# Patient Record
Sex: Female | Born: 1955 | Race: White | Hispanic: No | Marital: Married | Smoking: Never smoker
Health system: Southern US, Community
[De-identification: ages and names within clinical notes are randomized; demographics above are authoritative.]

## PROBLEM LIST (undated history)

## (undated) DIAGNOSIS — I8393 Asymptomatic varicose veins of bilateral lower extremities: Secondary | ICD-10-CM

## (undated) DIAGNOSIS — M1990 Unspecified osteoarthritis, unspecified site: Secondary | ICD-10-CM

## (undated) DIAGNOSIS — B009 Herpesviral infection, unspecified: Secondary | ICD-10-CM

## (undated) DIAGNOSIS — C4490 Unspecified malignant neoplasm of skin, unspecified: Secondary | ICD-10-CM

## (undated) HISTORY — DX: Unspecified malignant neoplasm of skin, unspecified: C44.90

---

## 2000-10-06 ENCOUNTER — Encounter: Payer: Self-pay | Admitting: *Deleted

## 2000-10-06 ENCOUNTER — Ambulatory Visit (HOSPITAL_COMMUNITY): Admission: RE | Admit: 2000-10-06 | Discharge: 2000-10-06 | Payer: Self-pay | Admitting: *Deleted

## 2000-11-01 ENCOUNTER — Other Ambulatory Visit: Admission: RE | Admit: 2000-11-01 | Discharge: 2000-11-01 | Payer: Self-pay | Admitting: *Deleted

## 2001-04-20 HISTORY — PX: GANGLION CYST EXCISION: SHX1691

## 2001-10-24 ENCOUNTER — Encounter: Payer: Self-pay | Admitting: *Deleted

## 2001-10-24 ENCOUNTER — Ambulatory Visit (HOSPITAL_COMMUNITY): Admission: RE | Admit: 2001-10-24 | Discharge: 2001-10-24 | Payer: Self-pay | Admitting: *Deleted

## 2002-10-31 ENCOUNTER — Ambulatory Visit (HOSPITAL_COMMUNITY): Admission: RE | Admit: 2002-10-31 | Discharge: 2002-10-31 | Payer: Self-pay | Admitting: *Deleted

## 2002-10-31 ENCOUNTER — Encounter: Payer: Self-pay | Admitting: *Deleted

## 2003-11-12 ENCOUNTER — Ambulatory Visit (HOSPITAL_COMMUNITY): Admission: RE | Admit: 2003-11-12 | Discharge: 2003-11-12 | Payer: Self-pay | Admitting: *Deleted

## 2004-04-16 ENCOUNTER — Other Ambulatory Visit: Admission: RE | Admit: 2004-04-16 | Discharge: 2004-04-16 | Payer: Self-pay | Admitting: Dermatology

## 2004-05-29 ENCOUNTER — Encounter (INDEPENDENT_AMBULATORY_CARE_PROVIDER_SITE_OTHER): Payer: Self-pay | Admitting: Specialist

## 2004-05-29 ENCOUNTER — Ambulatory Visit (HOSPITAL_BASED_OUTPATIENT_CLINIC_OR_DEPARTMENT_OTHER): Admission: RE | Admit: 2004-05-29 | Discharge: 2004-05-29 | Payer: Self-pay | Admitting: Orthopedic Surgery

## 2004-05-29 ENCOUNTER — Ambulatory Visit (HOSPITAL_COMMUNITY): Admission: RE | Admit: 2004-05-29 | Discharge: 2004-05-29 | Payer: Self-pay | Admitting: Orthopedic Surgery

## 2005-11-26 ENCOUNTER — Ambulatory Visit (HOSPITAL_COMMUNITY): Admission: RE | Admit: 2005-11-26 | Discharge: 2005-11-26 | Payer: Self-pay | Admitting: Internal Medicine

## 2006-11-30 ENCOUNTER — Ambulatory Visit (HOSPITAL_COMMUNITY): Admission: RE | Admit: 2006-11-30 | Discharge: 2006-11-30 | Payer: Self-pay | Admitting: Obstetrics and Gynecology

## 2007-12-01 ENCOUNTER — Ambulatory Visit (HOSPITAL_COMMUNITY): Admission: RE | Admit: 2007-12-01 | Discharge: 2007-12-01 | Payer: Self-pay | Admitting: Obstetrics and Gynecology

## 2008-12-10 ENCOUNTER — Ambulatory Visit (HOSPITAL_COMMUNITY): Admission: RE | Admit: 2008-12-10 | Discharge: 2008-12-10 | Payer: Self-pay | Admitting: Obstetrics and Gynecology

## 2009-12-16 ENCOUNTER — Ambulatory Visit (HOSPITAL_COMMUNITY): Admission: RE | Admit: 2009-12-16 | Discharge: 2009-12-16 | Payer: Self-pay | Admitting: Obstetrics and Gynecology

## 2010-09-05 NOTE — Op Note (Signed)
Peggy Fletcher, SILLIMAN NO.:  [id number]   MEDICAL RECORD NO.:  [id number]          PATIENT TYPE:  AMB   LOCATION:  DSC                          FACILITY:  [facility]   PHYSICIAN:  Cindee Salt, M.D.       DATE OF BIRTH:  [birth year]   DATE OF PROCEDURE:  05/29/2004  DATE OF DISCHARGE:                                 OPERATIVE REPORT   PREOPERATIVE DIAGNOSIS:  Mass in right palm.   POSTOPERATIVE DIAGNOSIS:  Mass in right palm.   OPERATION:  Excision of cyst of right palm.   SURGEON:  Cindee Salt, M.D.   ASSISTANTCarolyne Fiscal.   ANESTHESIA:  Forearm based IV regional.   HISTORY:  The patient is a 55 year old female with a history of a mass in  the metacarpophalangeal joint area of the ring and little fingers.  This has  become painful for her with enlargement.  She is desirous of removal.   DESCRIPTION OF PROCEDURE:  The patient was brought to the operating room  where a forearm based IV regional anesthetic was carried out without  difficulty.  She was prepped using Duraprep in the supine position with the  right arm free.  An oblique incision was made over the mass and carried down  through subcutaneous tissues.  Bleeders were electrocauterized.  Vascular  structures were identified and protected.  These were retracted.  A large  cyst was immediately apparent.  With blunt and sharp dissection, this was  dissected free.  Found to arise from the flexor sheath of the ring finger.  This area was opened.  The cyst was sent to pathology.  No further lesions  were identified.  The wound was irrigated.  The skin was closed with  interrupted 5-0 nylon sutures.  A sterile compressive dressing and splint  were applied.  The patient tolerated the procedure well and was taken to the  recovery room for observation in satisfactory condition.  She is discharged  home to return to the [facility] in one week on Vicodin.      GK/MEDQ  D:  05/29/2004  T:  05/29/2004  Job:   [id number]

## 2011-03-26 ENCOUNTER — Other Ambulatory Visit (HOSPITAL_COMMUNITY): Payer: Self-pay | Admitting: Obstetrics and Gynecology

## 2011-03-26 DIAGNOSIS — Z139 Encounter for screening, unspecified: Secondary | ICD-10-CM

## 2011-04-10 ENCOUNTER — Ambulatory Visit (HOSPITAL_COMMUNITY)
Admission: RE | Admit: 2011-04-10 | Discharge: 2011-04-10 | Disposition: A | Discharge status: Home / Self Care | Payer: BC Managed Care – PPO | Source: Ambulatory Visit | Attending: Obstetrics and Gynecology | Admitting: Obstetrics and Gynecology

## 2011-04-10 DIAGNOSIS — Z139 Encounter for screening, unspecified: Secondary | ICD-10-CM

## 2011-04-10 DIAGNOSIS — Z1231 Encounter for screening mammogram for malignant neoplasm of breast: Secondary | ICD-10-CM | POA: Insufficient documentation

## 2011-10-20 ENCOUNTER — Other Ambulatory Visit (HOSPITAL_COMMUNITY): Payer: Self-pay | Admitting: Family Medicine

## 2011-10-20 ENCOUNTER — Ambulatory Visit (HOSPITAL_COMMUNITY)
Admission: RE | Admit: 2011-10-20 | Discharge: 2011-10-20 | Disposition: A | Discharge status: Home / Self Care | Payer: BC Managed Care – PPO | Source: Ambulatory Visit | Attending: Family Medicine | Admitting: Family Medicine

## 2011-10-20 DIAGNOSIS — M25579 Pain in unspecified ankle and joints of unspecified foot: Secondary | ICD-10-CM | POA: Insufficient documentation

## 2012-03-16 ENCOUNTER — Encounter (HOSPITAL_COMMUNITY): Payer: Self-pay | Admitting: Pharmacy Technician

## 2012-03-25 NOTE — Patient Instructions (Addendum)
   20 Peggy Fletcher  03/25/2012   Your procedure is scheduled on:  03/31/2012  Report to [facility] at  750  AM.  Call this number if you have problems the morning of surgery: [phone]   Remember:   Do not eat food:After Midnight.  May have clear liquids:until Midnight .    Take these medicines the morning of surgery with A SIP OF WATER:  none   Do not wear jewelry, make-up or nail polish.  Do not wear lotions, powders, or perfumes.   Do not shave 48 hours prior to surgery. Men may shave face and neck.  Do not bring valuables to the hospital.  Contacts, dentures or bridgework may not be worn into surgery.  Leave suitcase in the car. After surgery it may be brought to your room.  For patients admitted to the hospital, checkout time is 11:00 AM the day of discharge.   Patients discharged the day of surgery will not be allowed to drive home.  Name and phone number of your driver: family  Special Instructions: Shower using CHG 2 nights before surgery and the night before surgery.  If you shower the day of surgery use CHG.  Use special wash - you have one bottle of CHG for all showers.  You should use approximately 1/3 of the bottle for each shower.   Please read over the following fact sheets that you were given: Pain Booklet, Coughing and Deep Breathing, MRSA Information, Surgical Site Infection Prevention, Anesthesia Post-op Instructions and Care and Recovery After Surgery PATIENT INSTRUCTIONS POST-ANESTHESIA  IMMEDIATELY FOLLOWING SURGERY:  Do not drive or operate machinery for the first twenty four hours after surgery.  Do not make any important decisions for twenty four hours after surgery or while taking narcotic pain medications or sedatives.  If you develop intractable nausea and vomiting or a severe headache please notify your doctor immediately.  FOLLOW-UP:  Please make an appointment with your surgeon as instructed. You do not need to follow up with anesthesia unless  specifically instructed to do so.  WOUND CARE INSTRUCTIONS (if applicable):  Keep a dry clean dressing on the anesthesia/puncture wound site if there is drainage.  Once the wound has quit draining you may leave it open to air.  Generally you should leave the bandage intact for twenty four hours unless there is drainage.  If the epidural site drains for more than 36-48 hours please call the anesthesia department.  QUESTIONS?:  Please feel free to call your physician or the hospital operator if you have any questions, and they will be happy to assist you.

## 2012-03-28 ENCOUNTER — Encounter (HOSPITAL_COMMUNITY)
Admission: RE | Admit: 2012-03-28 | Discharge: 2012-03-28 | Disposition: A | Discharge status: Home / Self Care | Payer: BC Managed Care – PPO | Source: Ambulatory Visit | Attending: Podiatry | Admitting: Podiatry

## 2012-03-28 ENCOUNTER — Encounter (HOSPITAL_COMMUNITY): Payer: Self-pay

## 2012-03-28 HISTORY — DX: Unspecified osteoarthritis, unspecified site: M19.90

## 2012-03-28 HISTORY — DX: Asymptomatic varicose veins of bilateral lower extremities: I83.93

## 2012-03-28 LAB — SURGICAL PCR SCREEN
MRSA, PCR: NEGATIVE
Staphylococcus aureus: NEGATIVE

## 2012-03-28 LAB — HEMOGLOBIN AND HEMATOCRIT, BLOOD
HCT: 38.4 % (ref 36.0–46.0)
Hemoglobin: 12.8 g/dL (ref 12.0–15.0)

## 2012-03-28 NOTE — Progress Notes (Signed)
Dr. Nolen Mu ordered Cleocin 600mg  IV pre-op for pt's upcoming surgery. Pt is allergic to Lincomycin with reaction of hives. There is a possibility of cross reaction to Cleocin because of allergy to Lincomycin so Dr. Nolen Mu was notified and he gave order to switch pre-op antibiotic to Vancomycin 1gm IV. Orders carried out.

## 2012-03-31 ENCOUNTER — Encounter (HOSPITAL_COMMUNITY): Payer: Self-pay | Admitting: Anesthesiology

## 2012-03-31 ENCOUNTER — Ambulatory Visit (HOSPITAL_COMMUNITY): Payer: BC Managed Care – PPO

## 2012-03-31 ENCOUNTER — Encounter (HOSPITAL_COMMUNITY)
Admission: RE | Disposition: A | Discharge status: Home / Self Care | Payer: Self-pay | Source: Ambulatory Visit | Attending: Podiatry

## 2012-03-31 ENCOUNTER — Encounter (HOSPITAL_COMMUNITY): Payer: Self-pay

## 2012-03-31 ENCOUNTER — Ambulatory Visit (HOSPITAL_COMMUNITY): Payer: BC Managed Care – PPO | Admitting: Anesthesiology

## 2012-03-31 ENCOUNTER — Ambulatory Visit (HOSPITAL_COMMUNITY)
Admission: RE | Admit: 2012-03-31 | Discharge: 2012-03-31 | Disposition: A | Discharge status: Home / Self Care | Payer: BC Managed Care – PPO | Source: Ambulatory Visit | Attending: Podiatry | Admitting: Podiatry

## 2012-03-31 DIAGNOSIS — M205X9 Other deformities of toe(s) (acquired), unspecified foot: Secondary | ICD-10-CM

## 2012-03-31 DIAGNOSIS — Z01812 Encounter for preprocedural laboratory examination: Secondary | ICD-10-CM | POA: Insufficient documentation

## 2012-03-31 DIAGNOSIS — I739 Peripheral vascular disease, unspecified: Secondary | ICD-10-CM | POA: Insufficient documentation

## 2012-03-31 HISTORY — DX: Herpesviral infection, unspecified: B00.9

## 2012-03-31 HISTORY — PX: CHEILECTOMY: SHX1336

## 2012-03-31 SURGERY — CHEILECTOMY
Anesthesia: Monitor Anesthesia Care | Site: Foot | Laterality: Right | Wound class: Clean

## 2012-03-31 MED ORDER — 0.9 % SODIUM CHLORIDE (POUR BTL) OPTIME
TOPICAL | Status: DC | PRN
  Administered 2012-03-31: 500 mL

## 2012-03-31 MED ORDER — MIDAZOLAM HCL 5 MG/5ML IJ SOLN
INTRAMUSCULAR | Status: DC | PRN
  Administered 2012-03-31: 2 mg via INTRAVENOUS

## 2012-03-31 MED ORDER — FENTANYL CITRATE 0.05 MG/ML IJ SOLN
INTRAMUSCULAR | Status: AC
  Filled 2012-03-31: qty 2

## 2012-03-31 MED ORDER — VANCOMYCIN HCL IN DEXTROSE 1-5 GM/200ML-% IV SOLN
INTRAVENOUS | Status: AC
  Filled 2012-03-31: qty 200

## 2012-03-31 MED ORDER — MIDAZOLAM HCL 2 MG/2ML IJ SOLN
1.0000 mg | INTRAMUSCULAR | Status: DC | PRN
  Administered 2012-03-31: 2 mg via INTRAVENOUS

## 2012-03-31 MED ORDER — MIDAZOLAM HCL 2 MG/2ML IJ SOLN
INTRAMUSCULAR | Status: AC
  Filled 2012-03-31: qty 2

## 2012-03-31 MED ORDER — PROPOFOL 10 MG/ML IV EMUL
INTRAVENOUS | Status: AC
  Filled 2012-03-31: qty 20

## 2012-03-31 MED ORDER — SODIUM CHLORIDE 0.9 % IV SOLN
INTRAVENOUS | Status: DC | PRN
  Administered 2012-03-31: 09:00:00 via INTRAVENOUS

## 2012-03-31 MED ORDER — BUPIVACAINE HCL (PF) 0.5 % IJ SOLN
INTRAMUSCULAR | Status: AC
  Filled 2012-03-31: qty 30

## 2012-03-31 MED ORDER — LACTATED RINGERS IV SOLN
INTRAVENOUS | Status: DC
  Administered 2012-03-31: 09:00:00 via INTRAVENOUS

## 2012-03-31 MED ORDER — VANCOMYCIN HCL 1000 MG IV SOLR
1000.0000 mg | INTRAVENOUS | Status: DC | PRN
  Administered 2012-03-31: 1000 mg via INTRAVENOUS

## 2012-03-31 MED ORDER — PROPOFOL INFUSION 10 MG/ML OPTIME
INTRAVENOUS | Status: DC | PRN
  Administered 2012-03-31: 55 ug/kg/min via INTRAVENOUS

## 2012-03-31 MED ORDER — FENTANYL CITRATE 0.05 MG/ML IJ SOLN
INTRAMUSCULAR | Status: DC | PRN
  Administered 2012-03-31: 50 ug via INTRAVENOUS
  Administered 2012-03-31: 25 ug via INTRAVENOUS

## 2012-03-31 MED ORDER — BUPIVACAINE HCL (PF) 0.5 % IJ SOLN
INTRAMUSCULAR | Status: DC | PRN
  Administered 2012-03-31: 20 mL

## 2012-03-31 MED ORDER — VANCOMYCIN HCL IN DEXTROSE 1-5 GM/200ML-% IV SOLN: 1000.0000 mg | INTRAVENOUS | Status: DC

## 2012-03-31 SURGICAL SUPPLY — 48 items
BAG HAMPER (MISCELLANEOUS) ×2 IMPLANT
BANDAGE ELASTIC 4 VELCRO NS (GAUZE/BANDAGES/DRESSINGS) ×2 IMPLANT
BANDAGE ESMARK 4X12 BL STRL LF (DISPOSABLE) ×1 IMPLANT
BANDAGE GAUZE ELAST BULKY 4 IN (GAUZE/BANDAGES/DRESSINGS) ×2 IMPLANT
BENZOIN TINCTURE PRP APPL 2/3 (GAUZE/BANDAGES/DRESSINGS) ×2 IMPLANT
BLADE AVERAGE 25X9 (BLADE) ×4 IMPLANT
BLADE OSC/SAGITTAL MD 9X18.5 (BLADE) IMPLANT
BLADE SURG 15 STRL LF DISP TIS (BLADE) ×1 IMPLANT
BLADE SURG 15 STRL SS (BLADE) ×1
BNDG ESMARK 4X12 BLUE STRL LF (DISPOSABLE) ×2
CHLORAPREP W/TINT 26ML (MISCELLANEOUS) ×2 IMPLANT
CLOTH BEACON ORANGE TIMEOUT ST (SAFETY) ×2 IMPLANT
COVER LIGHT HANDLE STERIS (MISCELLANEOUS) ×4 IMPLANT
CUFF TOURN SGL LL 12 (TOURNIQUET CUFF) ×2 IMPLANT
CUFF TOURNIQUET SINGLE 18IN (TOURNIQUET CUFF) ×2 IMPLANT
DECANTER SPIKE VIAL GLASS SM (MISCELLANEOUS) ×2 IMPLANT
DRAPE OEC MINIVIEW 54X84 (DRAPES) ×2 IMPLANT
DRSG ADAPTIC 3X8 NADH LF (GAUZE/BANDAGES/DRESSINGS) ×2 IMPLANT
DURA STEPPER LG (CAST SUPPLIES) IMPLANT
DURA STEPPER MED (CAST SUPPLIES) IMPLANT
DURA STEPPER SML (CAST SUPPLIES) IMPLANT
DURA STEPPER XL (SOFTGOODS) IMPLANT
ELECT REM PT RETURN 9FT ADLT (ELECTROSURGICAL) ×2
ELECTRODE REM PT RTRN 9FT ADLT (ELECTROSURGICAL) ×1 IMPLANT
GAUZE KERLIX 2  STERILE LF (GAUZE/BANDAGES/DRESSINGS) ×2 IMPLANT
GLOVE BIO SURGEON STRL SZ7.5 (GLOVE) ×2 IMPLANT
GOWN STRL REIN XL XLG (GOWN DISPOSABLE) ×6 IMPLANT
K-WIRE 6 (WIRE)
KIT ROOM TURNOVER APOR (KITS) ×2 IMPLANT
KWIRE 6 (WIRE) IMPLANT
Kirschner Wire ×2 IMPLANT
MANIFOLD NEPTUNE II (INSTRUMENTS) ×2 IMPLANT
MARKER SKIN DUAL TIP RULER LAB (MISCELLANEOUS) ×2 IMPLANT
NEEDLE HYPO 18GX1.5 BLUNT FILL (NEEDLE) ×2 IMPLANT
NEEDLE HYPO 27GX1-1/4 (NEEDLE) ×4 IMPLANT
NS IRRIG 1000ML POUR BTL (IV SOLUTION) ×2 IMPLANT
PACK BASIC LIMB (CUSTOM PROCEDURE TRAY) ×2 IMPLANT
PAD ARMBOARD 7.5X6 YLW CONV (MISCELLANEOUS) ×2 IMPLANT
RASP SM TEAR CROSS CUT (RASP) ×2 IMPLANT
SET BASIN LINEN APH (SET/KITS/TRAYS/PACK) ×2 IMPLANT
SPONGE GAUZE 4X4 12PLY (GAUZE/BANDAGES/DRESSINGS) ×2 IMPLANT
SPONGE LAP 18X18 X RAY DECT (DISPOSABLE) ×2 IMPLANT
STRIP CLOSURE SKIN 1/2X4 (GAUZE/BANDAGES/DRESSINGS) ×2 IMPLANT
SUT BONE WAX W31G (SUTURE) ×4 IMPLANT
SUT VIC AB 2-0 CT2 27 (SUTURE) ×2 IMPLANT
SUT VIC AB 4-0 PS2 27 (SUTURE) ×2 IMPLANT
SUT VICRYL AB 3-0 FS1 BRD 27IN (SUTURE) ×2 IMPLANT
SYR CONTROL 10ML LL (SYRINGE) ×4 IMPLANT

## 2012-03-31 NOTE — Anesthesia Preprocedure Evaluation (Signed)
Anesthesia Evaluation  Patient identified by MRN, date of birth, ID band Patient awake    Reviewed: Allergy & Precautions, H&P , NPO status , Patient's Chart, lab work & pertinent test results  Airway Mallampati: I      Dental  (+) Teeth Intact   Pulmonary neg pulmonary ROS,  breath sounds clear to auscultation        Cardiovascular + Peripheral Vascular Disease Rhythm:Regular Rate:Normal     Neuro/Psych    GI/Hepatic negative GI ROS,   Endo/Other    Renal/GU      Musculoskeletal   Abdominal   Peds  Hematology   Anesthesia Other Findings   Reproductive/Obstetrics                           Anesthesia Physical Anesthesia Plan  ASA: I  Anesthesia Plan: MAC   Post-op Pain Management:    Induction: Intravenous  Airway Management Planned: Nasal Cannula  Additional Equipment:   Intra-op Plan:   Post-operative Plan:   Informed Consent: I have reviewed the patients History and Physical, chart, labs and discussed the procedure including the risks, benefits and alternatives for the proposed anesthesia with the patient or authorized representative who has indicated his/her understanding and acceptance.     Plan Discussed with:   Anesthesia Plan Comments:         Anesthesia Quick Evaluation

## 2012-03-31 NOTE — Op Note (Signed)
OPERATIVE REPORT  SURGEON:   Dallas Schimke, [place]  OR STAFF:   Eliane Decree Page, RN - Circulator Hurshel Party, CST - Scrub Person Lizabeth Leyden, RN - RN First Assistant Rogene Houston, RN - Circulator Assistant   PREOPERATIVE DIAGNOSIS:   Hallux limitus right foot  POSTOPERATIVE DIAGNOSIS: Same  PROCEDURE: Cheilectomy right foot  ANESTHESIA:  Monitor Anesthesia Care   HEMOSTASIS:   Pneumatic ankle tourniquet set at 250 mmHg  ESTIMATED BLOOD LOSS:   Minimal (<5 cc)  MATERIALS USED:  Bone wax  INJECTABLES: Marcaine 0.5% plain; 20mL  PATHOLOGY:   None  COMPLICATIONS:   None  INDICATIONS:  Painful 1st MTPJ range of motion that has been non-responsive to non-surgical care.  DESCRIPTION OF THE PROCEDURE:   The patient was brought to the operating room and placed on the operative table in the supine position.  A pneumatic ankle tourniquet was applied to the patient's ankle.  Following sedation, the surgical site was anesthetized with 0.5% Marcaine plain.  The foot was then prepped, scrubbed, and draped in the usual sterile technique.  The foot was elevated, exsanguinated and the pneumatic ankle tourniquet inflated to 250 mmHg.    Attention was directed to the dorsal aspect of the right foot.  A linear longitudinal incision was made medial and parallel to the extensor hallucis longus tendon.  Dissection was continued deep down to the level of the first metatarsophalangeal joint.  A linear longitudinal periosteal and capsule are incision was performed.  The periosteal and capsular structures were reflected medially and laterally thus exposing the first metatarsophalangeal joint.  Spurring was noted along the dorsal aspect of the first metatarsal head with adaptive changes to the base of the proximal phalanx.  A prominent medial eminence of the first metatarsal head was also noted.  The articular surface of the first metatarsal head was evaluated.  The dorsal  50% of the articular surface of the first metatarsal head was denuded.  Using a power bone saw the dorsal spur the first metatarsal head and prominent medial eminence was resected.  A bone rongeur was used to resect the adaptive changes to the base of the proximal phalanx.  All bone edges were smoothed with a power rasp.  Bone wax was applied to the resected areas of bone.  A 0.045 inch Kirschner wire was used to drill the area of denuded cartilage of the first metatarsal headto promote fibrocartilage ingrowth.  The first MPJ range of motion was noted to be improved.  The wound was irrigated with copious amounts of sterile irrigant.  The periosteal and capsular structures were reapproximated using Vicryl.the subcutaneous structures were reapproximated using 4-0 Vicryl.  The skin was reapproximated using 4-0 Vicryl.  Skin closure was reinforced with Dermabond.  A sterile compressive dressing was applied to the right foot.  The patient's ankle tourniquet was deflated.  A prompt hyperemic response was noted to all digits of the operative foot.    The patient tolerated the procedure well.  The patient was then transferred to Short Stay with vital signs stable and vascular status intact to all toes of the operative foot.  Following a period of postoperative monitoring, the patient will be discharged home.

## 2012-03-31 NOTE — Brief Op Note (Signed)
BRIEF OPERATIVE NOTE  SURGEON:   Dallas Schimke, DPM  OR STAFF:   Eliane Decree Page, RN - Circulator Hurshel Party, CST - Scrub Person Lizabeth Leyden, RN - RN First Assistant Rogene Houston, RN - Circulator Assistant   PREOPERATIVE DIAGNOSIS:   Hallux limitus right foot  POSTOPERATIVE DIAGNOSIS: Same  PROCEDURE: Cheilectomy right foot  ANESTHESIA:  Monitor Anesthesia Care   HEMOSTASIS:   Pneumatic ankle tourniquet set at 250 mmHg  ESTIMATED BLOOD LOSS:   Minimal (<5 cc)  MATERIALS USED:  Bone wax  INJECTABLES: Marcaine 0.5% plain; 20mL  PATHOLOGY:   None  COMPLICATIONS:   None  INDICATIONS:  Painful 1st MTPJ range of motion that has been non-responsive to non-surgical care.  DICTATION:  Note written in EPIC

## 2012-03-31 NOTE — Anesthesia Postprocedure Evaluation (Signed)
  Anesthesia Post-op Note  Patient: Peggy Fletcher  Procedure(s) Performed: Procedure(s) (LRB) with comments: CHEILECTOMY (Right)  Patient Location: Short Stay  Anesthesia Type:MAC  Level of Consciousness: awake, alert , oriented and patient cooperative  Airway and Oxygen Therapy: Patient Spontanous Breathing  Post-op Pain: none  Post-op Assessment: Post-op Vital signs reviewed, Patient's Cardiovascular Status Stable, Respiratory Function Stable, Patent Airway and Pain level controlled  Post-op Vital Signs: Reviewed and stable  Complications: No apparent anesthesia complications

## 2012-03-31 NOTE — Transfer of Care (Signed)
Immediate Anesthesia Transfer of Care Note  Patient: Peggy Fletcher  Procedure(s) Performed: Procedure(s) (LRB) with comments: CHEILECTOMY (Right)  Patient Location: Short Stay  Anesthesia Type:MAC  Level of Consciousness: awake, alert , oriented and patient cooperative  Airway & Oxygen Therapy: Patient Spontanous Breathing  Post-op Assessment: Report given to PACU RN, Post -op Vital signs reviewed and stable and Patient moving all extremities  Post vital signs: Reviewed and stable  Complications: No apparent anesthesia complications

## 2012-03-31 NOTE — H&P (Signed)
HISTORY AND PHYSICAL INTERVAL NOTE:  03/31/2012  9:20 AM  Peggy Fletcher  has presented today for surgery, with the diagnosis of hallux limitus right foot.  The various methods of treatment have been discussed with the patient.  No guarantees were given.  After consideration of risks, benefits and other options for treatment, the patient has consented to surgery.  I have reviewed the patients' chart and labs.    Patient Vitals for the past 24 hrs:  BP Temp Temp src Pulse Resp SpO2  03/31/12 0905 109/71 mmHg - - - 15  100 %  03/31/12 0900 114/71 mmHg - - - 18  100 %  03/31/12 0855 119/78 mmHg - - - 22  100 %  03/31/12 0850 118/75 mmHg - - - - 98 %  03/31/12 0845 106/71 mmHg - - - 15  100 %  03/31/12 0840 112/76 mmHg - - - 14  100 %  03/31/12 0835 109/75 mmHg - - - 22  100 %  03/31/12 0830 120/78 mmHg - - - 14  100 %  03/31/12 0825 115/75 mmHg - - - 18  99 %  03/31/12 0820 115/78 mmHg - - - 13  99 %  03/31/12 0815 117/78 mmHg - - - 19  100 %  03/31/12 0810 127/81 mmHg - - - 31  98 %  03/31/12 0808 127/81 mmHg 98.1 F (36.7 C) Oral 67  22  99 %    A history and physical examination was performed in my office.  The patient was reexamined.  There have been no changes to this history and physical examination.  Dallas Schimke, DPM

## 2012-04-01 ENCOUNTER — Encounter (HOSPITAL_COMMUNITY): Payer: Self-pay | Admitting: Podiatry

## 2012-11-01 ENCOUNTER — Other Ambulatory Visit (HOSPITAL_COMMUNITY): Payer: Self-pay | Admitting: Obstetrics and Gynecology

## 2012-11-01 DIAGNOSIS — Z139 Encounter for screening, unspecified: Secondary | ICD-10-CM

## 2012-11-04 ENCOUNTER — Ambulatory Visit (HOSPITAL_COMMUNITY)
Admission: RE | Admit: 2012-11-04 | Discharge: 2012-11-04 | Disposition: A | Discharge status: Home / Self Care | Payer: BC Managed Care – PPO | Source: Ambulatory Visit | Attending: Obstetrics and Gynecology | Admitting: Obstetrics and Gynecology

## 2012-11-04 DIAGNOSIS — Z1231 Encounter for screening mammogram for malignant neoplasm of breast: Secondary | ICD-10-CM | POA: Insufficient documentation

## 2012-11-04 DIAGNOSIS — Z139 Encounter for screening, unspecified: Secondary | ICD-10-CM

## 2012-12-25 ENCOUNTER — Encounter (HOSPITAL_COMMUNITY): Payer: Self-pay

## 2012-12-25 ENCOUNTER — Emergency Department (HOSPITAL_COMMUNITY)
Admission: EM | Admit: 2012-12-25 | Discharge: 2012-12-25 | Disposition: A | Discharge status: Home / Self Care | Payer: BC Managed Care – PPO | Attending: Emergency Medicine | Admitting: Emergency Medicine

## 2012-12-25 DIAGNOSIS — Z8679 Personal history of other diseases of the circulatory system: Secondary | ICD-10-CM | POA: Insufficient documentation

## 2012-12-25 DIAGNOSIS — S81812A Laceration without foreign body, left lower leg, initial encounter: Secondary | ICD-10-CM

## 2012-12-25 DIAGNOSIS — W260XXA Contact with knife, initial encounter: Secondary | ICD-10-CM | POA: Insufficient documentation

## 2012-12-25 DIAGNOSIS — Y929 Unspecified place or not applicable: Secondary | ICD-10-CM | POA: Insufficient documentation

## 2012-12-25 DIAGNOSIS — Z8619 Personal history of other infectious and parasitic diseases: Secondary | ICD-10-CM | POA: Insufficient documentation

## 2012-12-25 DIAGNOSIS — M129 Arthropathy, unspecified: Secondary | ICD-10-CM | POA: Insufficient documentation

## 2012-12-25 DIAGNOSIS — Y9389 Activity, other specified: Secondary | ICD-10-CM | POA: Insufficient documentation

## 2012-12-25 DIAGNOSIS — S81009A Unspecified open wound, unspecified knee, initial encounter: Secondary | ICD-10-CM | POA: Insufficient documentation

## 2012-12-25 DIAGNOSIS — Z7982 Long term (current) use of aspirin: Secondary | ICD-10-CM | POA: Insufficient documentation

## 2012-12-25 MED ORDER — CYCLOBENZAPRINE HCL 10 MG PO TABS
10.0000 mg | ORAL_TABLET | Freq: Once | ORAL | Status: AC
  Administered 2012-12-25: 10 mg via ORAL
  Filled 2012-12-25: qty 1

## 2012-12-25 MED ORDER — LIDOCAINE HCL (PF) 1 % IJ SOLN
INTRAMUSCULAR | Status: AC
  Administered 2012-12-25: 5 mL
  Filled 2012-12-25: qty 5

## 2012-12-25 MED ORDER — LIDOCAINE HCL (PF) 1 % IJ SOLN
5.0000 mL | Freq: Once | INTRAMUSCULAR | Status: AC
  Administered 2012-12-25: 5 mL

## 2012-12-25 MED ORDER — ONDANSETRON 8 MG PO TBDP
8.0000 mg | ORAL_TABLET | Freq: Once | ORAL | Status: AC
  Administered 2012-12-25: 8 mg via ORAL
  Filled 2012-12-25: qty 1

## 2012-12-25 NOTE — ED Notes (Signed)
Upon standing to leave patient started to experience cramping in left calf. Patient became nauseated from cramps. Patient requesting something from cramping and nausea. EDPA made aware-verbal orders given.

## 2012-12-25 NOTE — ED Notes (Signed)
Pt tripped and cut left lower leg with knife,  Unsure of last tetanus shot

## 2012-12-25 NOTE — ED Provider Notes (Signed)
CSN: [id number]     Arrival date & time 12/25/12  1054 History   First MD Initiated Contact with Patient 12/25/12 1208     Chief Complaint  Patient presents with  . Extremity Laceration   (Consider location/radiation/quality/duration/timing/severity/associated sxs/prior Treatment) HPI Comments: Nocole Zammit is a 57 y.o. Female presenting with a laceration to her left anterior distal leg.  She tripped and was cut by a kitchen knife she had inside the tote she was carrying.  Her pain is improved at this time and the wound is hemostatic after applying gentle pressure.  She denies other injury.  She does not know the date of her last tetanus shot.     The history is provided by the patient and the spouse.    Past Medical History  Diagnosis Date  . Arthritis   . Varicose veins of legs   . Herpes    Past Surgical History  Procedure Laterality Date  . Ganglion cyst excision  2003    [facility] [place], [place]  . Cheilectomy  03/31/2012    Procedure: CHEILECTOMY;  Surgeon: Dallas Schimke, DPM;  Location: [facility];  Service: Orthopedics;  Laterality: Right;   No family history on file. History  Substance Use Topics  . Smoking status: Never Smoker   . Smokeless tobacco: Not on file  . Alcohol Use: No   OB History   Grav Para Term Preterm Abortions TAB SAB Ect Mult Living                 Review of Systems  Constitutional: Negative for fever and chills.  HENT: Negative for facial swelling.   Respiratory: Negative for shortness of breath and wheezing.   Skin: Positive for wound.  Neurological: Negative for numbness.    Allergies  Keflex and Lincocin  Home Medications   Current Outpatient Rx  Name  Route  Sig  Dispense  Refill  . aspirin EC 81 MG tablet   Oral   Take 81 mg by mouth daily.         Marland Kitchen ibuprofen (ADVIL,MOTRIN) 200 MG tablet   Oral   Take 800 mg by mouth every 6 (six) hours as needed. Pain.          BP 112/87  Pulse 75  Temp(Src)  97.6 F (36.4 C) (Oral)  Resp 20  Ht 5\' 7"  (1.702 m)  Wt 158 lb (71.668 kg)  BMI 24.74 kg/m2  SpO2 100% Physical Exam  Constitutional: She is oriented to person, place, and time. She appears well-developed and well-nourished.  HENT:  Head: Normocephalic and atraumatic.  Cardiovascular: Normal rate.   Pulmonary/Chest: Effort normal.  Musculoskeletal: She exhibits no tenderness.  Neurological: She is alert and oriented to person, place, and time. No sensory deficit.  Skin: Laceration noted.  1.5 cm laceration through subcutaneous layer of left anterior lower leg, hemostatic.    ED Course  Procedures (including critical care time)   LACERATION REPAIR Performed by: Burgess Amor Authorized by: Burgess Amor Consent: Verbal consent obtained. Risks and benefits: risks, benefits and alternatives were discussed Consent given by: patient Patient identity confirmed: provided demographic data Prepped and Draped in normal sterile fashion Wound explored  Laceration Location: left lower leg  Laceration Length: 1.5 cm  No Foreign Bodies seen or palpated  Anesthesia: local infiltration  Local anesthetic: lidocaine 1% without epinephrine  Anesthetic total: 2 ml  Irrigation method: syringe Amount of cleaning: standard  Skin closure: ethilon 4-0  Number  of sutures: 4  Technique: simple interrupted Patient tolerance: Patient tolerated the procedure well with no immediate complications.  Labs Review Labs Reviewed - No data to display Imaging Review No results found.  MDM   1. Laceration of left leg, initial encounter    Wound care instructions given.  Pt advised to have sutures removed in 10 days,  Return here sooner for any signs of infection including redness, swelling, worse pain or drainage of pus.       Burgess Amor, PA-C 12/25/12 2106

## 2012-12-27 NOTE — ED Provider Notes (Signed)
Medical screening examination/treatment/procedure(s) were performed by non-physician practitioner and as supervising physician I was immediately available for consultation/collaboration.  Rhianon Zabawa, MD 12/27/12 0016 

## 2013-09-19 ENCOUNTER — Encounter (INDEPENDENT_AMBULATORY_CARE_PROVIDER_SITE_OTHER): Payer: Self-pay

## 2013-09-19 ENCOUNTER — Ambulatory Visit (HOSPITAL_COMMUNITY)
Admission: RE | Admit: 2013-09-19 | Discharge: 2013-09-19 | Disposition: A | Discharge status: Home / Self Care | Payer: BC Managed Care – PPO | Source: Ambulatory Visit | Attending: Family Medicine | Admitting: Family Medicine

## 2013-09-19 ENCOUNTER — Other Ambulatory Visit (HOSPITAL_COMMUNITY): Payer: Self-pay | Admitting: Family Medicine

## 2013-09-19 DIAGNOSIS — M1610 Unilateral primary osteoarthritis, unspecified hip: Secondary | ICD-10-CM

## 2013-09-19 DIAGNOSIS — M169 Osteoarthritis of hip, unspecified: Secondary | ICD-10-CM | POA: Insufficient documentation

## 2013-09-19 DIAGNOSIS — M47817 Spondylosis without myelopathy or radiculopathy, lumbosacral region: Secondary | ICD-10-CM | POA: Insufficient documentation

## 2013-11-06 ENCOUNTER — Other Ambulatory Visit (HOSPITAL_COMMUNITY): Payer: Self-pay | Admitting: Obstetrics and Gynecology

## 2013-11-06 DIAGNOSIS — Z1231 Encounter for screening mammogram for malignant neoplasm of breast: Secondary | ICD-10-CM

## 2013-11-09 ENCOUNTER — Ambulatory Visit (HOSPITAL_COMMUNITY)
Admission: RE | Admit: 2013-11-09 | Discharge: 2013-11-09 | Disposition: A | Discharge status: Home / Self Care | Payer: BC Managed Care – PPO | Source: Ambulatory Visit | Attending: Obstetrics and Gynecology | Admitting: Obstetrics and Gynecology

## 2013-11-09 DIAGNOSIS — Z1231 Encounter for screening mammogram for malignant neoplasm of breast: Secondary | ICD-10-CM

## 2014-11-05 ENCOUNTER — Other Ambulatory Visit (HOSPITAL_COMMUNITY): Payer: Self-pay | Admitting: Family Medicine

## 2014-11-05 DIAGNOSIS — E038 Other specified hypothyroidism: Secondary | ICD-10-CM

## 2014-11-05 DIAGNOSIS — Z1389 Encounter for screening for other disorder: Secondary | ICD-10-CM

## 2014-11-05 DIAGNOSIS — M1991 Primary osteoarthritis, unspecified site: Secondary | ICD-10-CM

## 2014-11-13 ENCOUNTER — Ambulatory Visit (HOSPITAL_COMMUNITY)
Admission: RE | Admit: 2014-11-13 | Discharge: 2014-11-13 | Disposition: A | Discharge status: Home / Self Care | Payer: BC Managed Care – PPO | Source: Ambulatory Visit | Attending: Family Medicine | Admitting: Family Medicine

## 2014-11-13 DIAGNOSIS — M8580 Other specified disorders of bone density and structure, unspecified site: Secondary | ICD-10-CM | POA: Diagnosis not present

## 2014-11-13 DIAGNOSIS — Z1389 Encounter for screening for other disorder: Secondary | ICD-10-CM

## 2014-11-13 DIAGNOSIS — R29890 Loss of height: Secondary | ICD-10-CM | POA: Diagnosis not present

## 2014-11-13 DIAGNOSIS — Z780 Asymptomatic menopausal state: Secondary | ICD-10-CM | POA: Diagnosis present

## 2014-11-13 DIAGNOSIS — E038 Other specified hypothyroidism: Secondary | ICD-10-CM

## 2014-11-13 DIAGNOSIS — M1991 Primary osteoarthritis, unspecified site: Secondary | ICD-10-CM

## 2015-03-01 ENCOUNTER — Other Ambulatory Visit (HOSPITAL_COMMUNITY): Payer: Self-pay | Admitting: Obstetrics and Gynecology

## 2015-03-01 DIAGNOSIS — Z1231 Encounter for screening mammogram for malignant neoplasm of breast: Secondary | ICD-10-CM

## 2015-03-18 ENCOUNTER — Ambulatory Visit (HOSPITAL_COMMUNITY)
Admission: RE | Admit: 2015-03-18 | Discharge: 2015-03-18 | Disposition: A | Discharge status: Home / Self Care | Payer: BC Managed Care – PPO | Source: Ambulatory Visit | Attending: Obstetrics and Gynecology | Admitting: Obstetrics and Gynecology

## 2015-03-18 DIAGNOSIS — Z1231 Encounter for screening mammogram for malignant neoplasm of breast: Secondary | ICD-10-CM | POA: Diagnosis present

## 2017-10-04 ENCOUNTER — Other Ambulatory Visit (HOSPITAL_COMMUNITY): Payer: Self-pay | Admitting: Obstetrics and Gynecology

## 2017-10-04 DIAGNOSIS — Z1231 Encounter for screening mammogram for malignant neoplasm of breast: Secondary | ICD-10-CM

## 2017-10-11 ENCOUNTER — Ambulatory Visit (HOSPITAL_COMMUNITY): Payer: BC Managed Care – PPO

## 2017-10-14 ENCOUNTER — Ambulatory Visit (HOSPITAL_COMMUNITY): Payer: BC Managed Care – PPO

## 2017-10-28 ENCOUNTER — Ambulatory Visit (HOSPITAL_COMMUNITY)
Admission: RE | Admit: 2017-10-28 | Discharge: 2017-10-28 | Disposition: A | Discharge status: Home / Self Care | Payer: BC Managed Care – PPO | Source: Ambulatory Visit | Attending: Obstetrics and Gynecology | Admitting: Obstetrics and Gynecology

## 2017-10-28 DIAGNOSIS — Z1231 Encounter for screening mammogram for malignant neoplasm of breast: Secondary | ICD-10-CM | POA: Insufficient documentation

## 2018-10-25 ENCOUNTER — Other Ambulatory Visit (HOSPITAL_COMMUNITY): Payer: Self-pay | Admitting: Family Medicine

## 2018-10-25 DIAGNOSIS — Z1231 Encounter for screening mammogram for malignant neoplasm of breast: Secondary | ICD-10-CM

## 2018-11-02 ENCOUNTER — Other Ambulatory Visit: Payer: Self-pay

## 2018-11-02 ENCOUNTER — Ambulatory Visit (HOSPITAL_COMMUNITY)
Admission: RE | Admit: 2018-11-02 | Discharge: 2018-11-02 | Disposition: A | Discharge status: Home / Self Care | Payer: BC Managed Care – PPO | Source: Ambulatory Visit | Attending: Family Medicine | Admitting: Family Medicine

## 2018-11-02 DIAGNOSIS — Z1231 Encounter for screening mammogram for malignant neoplasm of breast: Secondary | ICD-10-CM | POA: Insufficient documentation

## 2018-11-03 ENCOUNTER — Other Ambulatory Visit (HOSPITAL_COMMUNITY): Payer: Self-pay | Admitting: Family Medicine

## 2018-11-03 DIAGNOSIS — R928 Other abnormal and inconclusive findings on diagnostic imaging of breast: Secondary | ICD-10-CM

## 2018-11-08 ENCOUNTER — Ambulatory Visit (HOSPITAL_COMMUNITY)
Admission: RE | Admit: 2018-11-08 | Discharge: 2018-11-08 | Disposition: A | Discharge status: Home / Self Care | Payer: BC Managed Care – PPO | Source: Ambulatory Visit | Attending: Family Medicine | Admitting: Family Medicine

## 2018-11-08 ENCOUNTER — Other Ambulatory Visit: Payer: Self-pay

## 2018-11-08 ENCOUNTER — Ambulatory Visit (HOSPITAL_COMMUNITY): Admission: RE | Admit: 2018-11-08 | Payer: BC Managed Care – PPO | Source: Ambulatory Visit

## 2018-11-08 DIAGNOSIS — R928 Other abnormal and inconclusive findings on diagnostic imaging of breast: Secondary | ICD-10-CM

## 2019-10-05 ENCOUNTER — Other Ambulatory Visit (HOSPITAL_COMMUNITY): Payer: Self-pay | Admitting: Family Medicine

## 2019-10-05 DIAGNOSIS — Z1231 Encounter for screening mammogram for malignant neoplasm of breast: Secondary | ICD-10-CM

## 2019-11-06 ENCOUNTER — Ambulatory Visit (HOSPITAL_COMMUNITY): Payer: BC Managed Care – PPO

## 2019-11-13 ENCOUNTER — Ambulatory Visit (HOSPITAL_COMMUNITY)
Admission: RE | Admit: 2019-11-13 | Discharge: 2019-11-13 | Disposition: A | Discharge status: Home / Self Care | Payer: BC Managed Care – PPO | Source: Ambulatory Visit | Attending: Family Medicine | Admitting: Family Medicine

## 2019-11-13 ENCOUNTER — Other Ambulatory Visit: Payer: Self-pay

## 2019-11-13 DIAGNOSIS — Z1231 Encounter for screening mammogram for malignant neoplasm of breast: Secondary | ICD-10-CM | POA: Diagnosis present

## 2020-05-25 LAB — COLOGUARD: COLOGUARD: NEGATIVE

## 2020-10-15 ENCOUNTER — Other Ambulatory Visit (HOSPITAL_COMMUNITY): Payer: Self-pay | Admitting: Family Medicine

## 2020-10-15 DIAGNOSIS — Z1231 Encounter for screening mammogram for malignant neoplasm of breast: Secondary | ICD-10-CM

## 2020-11-11 ENCOUNTER — Other Ambulatory Visit: Payer: Self-pay

## 2020-11-11 ENCOUNTER — Ambulatory Visit (HOSPITAL_COMMUNITY)
Admission: RE | Admit: 2020-11-11 | Discharge: 2020-11-11 | Disposition: A | Discharge status: Home / Self Care | Payer: BC Managed Care – PPO | Source: Ambulatory Visit | Attending: Family Medicine | Admitting: Family Medicine

## 2020-11-11 DIAGNOSIS — Z1231 Encounter for screening mammogram for malignant neoplasm of breast: Secondary | ICD-10-CM | POA: Insufficient documentation

## 2021-08-04 ENCOUNTER — Encounter (INDEPENDENT_AMBULATORY_CARE_PROVIDER_SITE_OTHER): Payer: Self-pay | Admitting: *Deleted

## 2021-09-04 ENCOUNTER — Other Ambulatory Visit (INDEPENDENT_AMBULATORY_CARE_PROVIDER_SITE_OTHER): Payer: Self-pay

## 2021-09-04 DIAGNOSIS — Z1211 Encounter for screening for malignant neoplasm of colon: Secondary | ICD-10-CM

## 2021-09-17 ENCOUNTER — Telehealth (INDEPENDENT_AMBULATORY_CARE_PROVIDER_SITE_OTHER): Payer: Self-pay

## 2021-09-17 ENCOUNTER — Encounter (INDEPENDENT_AMBULATORY_CARE_PROVIDER_SITE_OTHER): Payer: Self-pay

## 2021-09-17 MED ORDER — PEG 3350-KCL-NA BICARB-NACL 420 G PO SOLR: 4000.0000 mL | ORAL | 0 refills | Status: DC

## 2021-09-17 MED ORDER — NA SULFATE-K SULFATE-MG SULF 17.5-3.13-1.6 GM/177ML PO SOLN: 1.0000 | ORAL | 0 refills | Status: DC

## 2021-09-17 NOTE — Telephone Encounter (Signed)
Peggy Fletcher Peggy Fletcher, CMA  ?

## 2021-09-17 NOTE — Telephone Encounter (Signed)
Refoel Palladino Ann Deandrea Rion, CMA  ?

## 2021-09-17 NOTE — Telephone Encounter (Signed)
Ok to schedule.  Thanks,  Anntionette Madkins Castaneda Mayorga, MD Gastroenterology and Hepatology [facility]  

## 2021-09-17 NOTE — Telephone Encounter (Signed)
Referring MD/PCP: Hilma Favors  Procedure: Tcs  Reason/Indication:  Screening  Has patient had this procedure before?  yes  If so, when, by whom and where? 2008  Is there a family history of colon cancer?  no  Who?  What age when diagnosed?    Is patient diabetic? If yes, Type 1 or Type 2   no      Does patient have prosthetic heart valve or mechanical valve?  no  Do you have a pacemaker/defibrillator?  no  Has patient ever had endocarditis/atrial fibrillation? no  Does patient use oxygen? no  Has patient had joint replacement within last 12 months?  no  Is patient constipated or do they take laxatives? no  Does patient have a history of alcohol/drug use?  no  Have you had a stroke/heart attack last 6 mths? no  Do you take medicine for weight loss?  no  For female patients,: have you had a hysterectomy no                      are you post menopausal yes                      do you still have your menstrual cycle no  Is patient on blood thinner such as Coumadin, Plavix and/or Aspirin? yes  Medications: asa 81 mg daily, levothyroxine 75 mcg daily, valacyclovir 1 gr PRN, xolpidem 10 mg prn, meloxicam 15 mg prn   Allergies: Keflex   Medication Adjustment per Dr Jenetta Downer none  Procedure date & time: 10/17/21 at 7:30

## 2021-09-18 ENCOUNTER — Encounter (INDEPENDENT_AMBULATORY_CARE_PROVIDER_SITE_OTHER): Payer: Self-pay | Admitting: *Deleted

## 2021-10-13 ENCOUNTER — Other Ambulatory Visit (HOSPITAL_COMMUNITY): Payer: Self-pay | Admitting: Family Medicine

## 2021-10-13 DIAGNOSIS — Z1231 Encounter for screening mammogram for malignant neoplasm of breast: Secondary | ICD-10-CM

## 2021-10-14 ENCOUNTER — Other Ambulatory Visit (INDEPENDENT_AMBULATORY_CARE_PROVIDER_SITE_OTHER): Payer: Self-pay

## 2021-10-17 ENCOUNTER — Ambulatory Visit (HOSPITAL_COMMUNITY): Payer: Medicare PPO | Admitting: Certified Registered"

## 2021-10-17 ENCOUNTER — Encounter (HOSPITAL_COMMUNITY)
Admission: RE | Disposition: A | Discharge status: Home / Self Care | Payer: Self-pay | Source: Home / Self Care | Attending: Gastroenterology

## 2021-10-17 ENCOUNTER — Ambulatory Visit (HOSPITAL_COMMUNITY)
Admission: RE | Admit: 2021-10-17 | Discharge: 2021-10-17 | Disposition: A | Discharge status: Home / Self Care | Payer: Medicare PPO | Attending: Gastroenterology | Admitting: Gastroenterology

## 2021-10-17 ENCOUNTER — Encounter (HOSPITAL_COMMUNITY): Payer: Self-pay | Admitting: Gastroenterology

## 2021-10-17 ENCOUNTER — Ambulatory Visit (HOSPITAL_BASED_OUTPATIENT_CLINIC_OR_DEPARTMENT_OTHER): Payer: Medicare PPO | Admitting: Certified Registered"

## 2021-10-17 ENCOUNTER — Other Ambulatory Visit: Payer: Self-pay

## 2021-10-17 DIAGNOSIS — K648 Other hemorrhoids: Secondary | ICD-10-CM

## 2021-10-17 DIAGNOSIS — Z1211 Encounter for screening for malignant neoplasm of colon: Secondary | ICD-10-CM | POA: Diagnosis not present

## 2021-10-17 HISTORY — PX: COLONOSCOPY WITH PROPOFOL: SHX5780

## 2021-10-17 LAB — HM COLONOSCOPY

## 2021-10-17 SURGERY — COLONOSCOPY WITH PROPOFOL
Anesthesia: General

## 2021-10-17 MED ORDER — LACTATED RINGERS IV SOLN: INTRAVENOUS | Status: DC

## 2021-10-17 MED ORDER — PROPOFOL 10 MG/ML IV BOLUS
INTRAVENOUS | Status: DC | PRN
  Administered 2021-10-17: 100 mg via INTRAVENOUS
  Administered 2021-10-17: 50 mg via INTRAVENOUS

## 2021-10-17 MED ORDER — LIDOCAINE HCL (CARDIAC) PF 100 MG/5ML IV SOSY
PREFILLED_SYRINGE | INTRAVENOUS | Status: DC | PRN
  Administered 2021-10-17: 50 mg via INTRAVENOUS

## 2021-10-17 MED ORDER — PROPOFOL 500 MG/50ML IV EMUL
INTRAVENOUS | Status: DC | PRN
  Administered 2021-10-17: 150 ug/kg/min via INTRAVENOUS

## 2021-10-17 MED ORDER — LACTATED RINGERS IV SOLN: INTRAVENOUS | Status: DC | PRN

## 2021-10-17 NOTE — Anesthesia Preprocedure Evaluation (Signed)

## 2021-10-17 NOTE — Discharge Instructions (Signed)
You are being discharged to home.  Resume your previous diet.  Your physician has recommended a repeat colonoscopy in 10 years for screening purposes.  

## 2021-10-17 NOTE — Transfer of Care (Signed)
Immediate Anesthesia Transfer of Care Note  Patient: Peggy Fletcher  Procedure(s) Performed: COLONOSCOPY WITH PROPOFOL  Patient Location: Endoscopy Unit  Anesthesia Type:General  Level of Consciousness: drowsy  Airway & Oxygen Therapy: Patient Spontanous Breathing  Post-op Assessment: Report given to RN and Post -op Vital signs reviewed and stable  Post vital signs: Reviewed and stable  Last Vitals:  Vitals Value Taken Time  BP    Temp    Pulse    Resp    SpO2      Last Pain:  Vitals:   10/17/21 0739  TempSrc:   PainSc: 0-No pain         Complications: No notable events documented.

## 2021-10-17 NOTE — Op Note (Signed)
[facility] Patient Name: Peggy Fletcher [facility] Procedure Date: 10/17/2021 7:10 AM MRN: [id number] Date of Birth: February 23, 1956 Attending MD: Katrinka Blazing ,  CSN: [id number] Age: 66 Admit Type: Outpatient Procedure:                Colonoscopy Indications:              Screening for colorectal malignant neoplasm Providers:                Katrinka Blazing, Sheran Fava, Ina Homes,                            Technician Referring MD:              Medicines:                Monitored Anesthesia Care Complications:            No immediate complications. Estimated Blood Loss:     Estimated blood loss: none. Procedure:                Pre-Anesthesia Assessment:                           - Prior to the procedure, a History and Physical                            was performed, and patient medications, allergies                            and sensitivities were reviewed. The patient's                            tolerance of previous anesthesia was reviewed.                           - The risks and benefits of the procedure and the                            sedation options and risks were discussed with the                            patient. All questions were answered and informed                            consent was obtained.                           - ASA Grade Assessment: II - A patient with mild                            systemic disease.                           After obtaining informed consent, the colonoscope                            was passed under direct vision. Throughout the  procedure, the patient's blood pressure, pulse, and                            oxygen saturations were monitored continuously. The                            [id number]-HQ190L ([id number]) scope was introduced through                            the anus and advanced to the the cecum, identified                            by appendiceal orifice and ileocecal valve. The                             colonoscopy was performed without difficulty. The                            patient tolerated the procedure well. The quality                            of the bowel preparation was adequate. Scope In: 7:38:26 AM Scope Out: 8:01:37 AM Scope Withdrawal Time: 0 hours 14 minutes 29 seconds  Total Procedure Duration: 0 hours 23 minutes 11 seconds  Findings:      The perianal and digital rectal examinations were normal.      The colon (entire examined portion) appeared normal.      Non-bleeding internal hemorrhoids were found during retroflexion. The       hemorrhoids were small. Impression:               - The entire examined colon is normal.                           - Non-bleeding internal hemorrhoids.                           - No specimens collected. Moderate Sedation:      Per Anesthesia Care Recommendation:           - Discharge patient to home (ambulatory).                           - Resume previous diet.                           - Repeat colonoscopy in 10 years for screening                            purposes. Procedure Code(s):        --- Professional ---                           B5102, Colorectal cancer screening; colonoscopy on                            individual not meeting criteria for high risk Diagnosis  Code(s):        --- Professional ---                           Z12.11, Encounter for screening for malignant                            neoplasm of colon                           K64.8, Other hemorrhoids CPT copyright 2019 American Medical Association. All rights reserved. The codes documented in this report are preliminary and upon coder review may  be revised to meet current compliance requirements. Katrinka Blazing, MD Katrinka Blazing,  10/17/2021 8:08:30 AM This report has been signed electronically. Number of Addenda: 0

## 2021-10-17 NOTE — Anesthesia Procedure Notes (Signed)
Date/Time: 10/17/2021 7:37 AM  Performed by: Julian Reil, CRNAPre-anesthesia Checklist: Patient identified, Emergency Drugs available, Suction available and Patient being monitored Patient Re-evaluated:Patient Re-evaluated prior to induction Oxygen Delivery Method: Nasal cannula Induction Type: IV induction Placement Confirmation: positive ETCO2

## 2021-10-17 NOTE — H&P (Signed)
Peggy Fletcher is an 66 y.o. female.   Chief Complaint: screening CRC cancer HPI: 66 y/o f, coming for screening colonoscopy. Last colonoscopy 15 years ago was normal.  The patient denies having any complaints such as melena, hematochezia, abdominal pain or distention, change in her bowel movement consistency or frequency, no changes in her weight recently.  No family history of colorectal cancer.   Past Medical History:  Diagnosis Date   Arthritis    Herpes    Varicose veins of legs     Past Surgical History:  Procedure Laterality Date   CHEILECTOMY  03/31/2012   Procedure: CHEILECTOMY;  Surgeon: Marcheta Grammes, DPM;  Location: [facility];  Service: Orthopedics;  Laterality: Right;   GANGLION CYST EXCISION  2003   rt [facility] in [place], [place]    History reviewed. No pertinent family history. Social History:  reports that she has never smoked. She does not have any smokeless tobacco history on file. She reports that she does not drink alcohol and does not use drugs.  Allergies:  Allergies  Allergen Reactions   Keflex [Cephalexin] Hives   Lincocin [Lincomycin Hcl] Hives    Medications Prior to Admission  Medication Sig Dispense Refill   acetaminophen (TYLENOL) 500 MG tablet Take 1,000 mg by mouth every 6 (six) hours as needed for headache, moderate pain or mild pain.     aspirin EC 81 MG tablet Take 81 mg by mouth daily.     Calcium Carb-Cholecalciferol (CALCIUM 500 +D PO) Take 1 tablet by mouth daily. 25 mcg     Cholecalciferol (VITAMIN D3) 50 MCG (2000 UT) capsule Take 2,000 Units by mouth daily.     Cyanocobalamin 1500 MCG TBDP Take 3,000 mcg by mouth daily. Gummy     levothyroxine (SYNTHROID) 75 MCG tablet Take 75 mcg by mouth daily.     melatonin 5 MG TABS Take 2.5 mg by mouth at bedtime.     meloxicam (MOBIC) 15 MG tablet Take 15 mg by mouth daily as needed (Muscle pain).     Na Sulfate-K Sulfate-Mg Sulf (SUPREP BOWEL PREP KIT) 17.5-3.13-1.6 GM/177ML SOLN  Take 1 kit by mouth as directed. 354 mL 0   valACYclovir (VALTREX) 1000 MG tablet Take 2,000 mg by mouth daily as needed (outbreak). Take 2000 12 hrs later      No results found for this or any previous visit (from the past 48 hour(s)). No results found.  Review of Systems  All other systems reviewed and are negative.   Blood pressure 129/80, temperature 97.6 F (36.4 C), temperature source Oral, resp. rate 16, height 5' 7"  (1.702 m), weight 72.6 kg, SpO2 100 %. Physical Exam  GENERAL: The patient is AO x3, in no acute distress. HEENT: Head is normocephalic and atraumatic. EOMI are intact. Mouth is well hydrated and without lesions. NECK: Supple. No masses LUNGS: Clear to auscultation. No presence of rhonchi/wheezing/rales. Adequate chest expansion HEART: RRR, normal s1 and s2. ABDOMEN: Soft, nontender, no guarding, no peritoneal signs, and nondistended. BS +. No masses. EXTREMITIES: Without any cyanosis, clubbing, rash, lesions or edema. NEUROLOGIC: AOx3, no focal motor deficit. SKIN: no jaundice, no rashes  Assessment/Plan 66 y/o f, coming for screening colonoscopy. The patient is at average risk for colorectal cancer.  We will proceed with colonoscopy today. o changes in her weight recently.  No family history of colorectal cancer.  Peggy Quale, MD 10/17/2021, 7:31 AM

## 2021-10-18 NOTE — Anesthesia Postprocedure Evaluation (Signed)
Anesthesia Post Note  Patient: Korissa Horsford Brix  Procedure(s) Performed: COLONOSCOPY WITH PROPOFOL  Patient location during evaluation: Phase II Anesthesia Type: General Level of consciousness: awake Pain management: pain level controlled Vital Signs Assessment: post-procedure vital signs reviewed and stable Respiratory status: spontaneous breathing and respiratory function stable Cardiovascular status: blood pressure returned to baseline and stable Postop Assessment: no headache and no apparent nausea or vomiting Anesthetic complications: no Comments: Late entry   No notable events documented.   Last Vitals:  Vitals:   10/17/21 0804 10/17/21 0810  BP:  105/62  Pulse: (!) 59   Resp: 18   Temp: 36.4 C   SpO2: 98%     Last Pain:  Vitals:   10/17/21 0804  TempSrc: Axillary  PainSc: 0-No pain                 Windell Norfolk

## 2021-10-22 ENCOUNTER — Encounter (INDEPENDENT_AMBULATORY_CARE_PROVIDER_SITE_OTHER): Payer: Self-pay | Admitting: *Deleted

## 2021-10-23 ENCOUNTER — Encounter (HOSPITAL_COMMUNITY): Payer: Self-pay | Admitting: Gastroenterology

## 2021-11-20 ENCOUNTER — Ambulatory Visit (HOSPITAL_COMMUNITY)
Admission: RE | Admit: 2021-11-20 | Discharge: 2021-11-20 | Disposition: A | Discharge status: Home / Self Care | Payer: Medicare PPO | Source: Ambulatory Visit | Attending: Family Medicine | Admitting: Family Medicine

## 2021-11-20 DIAGNOSIS — Z1231 Encounter for screening mammogram for malignant neoplasm of breast: Secondary | ICD-10-CM | POA: Insufficient documentation

## 2022-02-19 DIAGNOSIS — Z23 Encounter for immunization: Secondary | ICD-10-CM | POA: Diagnosis not present

## 2022-02-19 DIAGNOSIS — E663 Overweight: Secondary | ICD-10-CM | POA: Diagnosis not present

## 2022-02-19 DIAGNOSIS — J069 Acute upper respiratory infection, unspecified: Secondary | ICD-10-CM | POA: Diagnosis not present

## 2022-02-19 DIAGNOSIS — Z6825 Body mass index (BMI) 25.0-25.9, adult: Secondary | ICD-10-CM | POA: Diagnosis not present

## 2022-02-19 DIAGNOSIS — S76912A Strain of unspecified muscles, fascia and tendons at thigh level, left thigh, initial encounter: Secondary | ICD-10-CM | POA: Diagnosis not present

## 2022-02-23 DIAGNOSIS — M9903 Segmental and somatic dysfunction of lumbar region: Secondary | ICD-10-CM | POA: Diagnosis not present

## 2022-02-23 DIAGNOSIS — M5432 Sciatica, left side: Secondary | ICD-10-CM | POA: Diagnosis not present

## 2022-03-23 DIAGNOSIS — M9903 Segmental and somatic dysfunction of lumbar region: Secondary | ICD-10-CM | POA: Diagnosis not present

## 2022-03-23 DIAGNOSIS — M5432 Sciatica, left side: Secondary | ICD-10-CM | POA: Diagnosis not present

## 2022-04-06 DIAGNOSIS — M5432 Sciatica, left side: Secondary | ICD-10-CM | POA: Diagnosis not present

## 2022-04-06 DIAGNOSIS — M9903 Segmental and somatic dysfunction of lumbar region: Secondary | ICD-10-CM | POA: Diagnosis not present

## 2022-04-29 DIAGNOSIS — M9903 Segmental and somatic dysfunction of lumbar region: Secondary | ICD-10-CM | POA: Diagnosis not present

## 2022-04-29 DIAGNOSIS — M5432 Sciatica, left side: Secondary | ICD-10-CM | POA: Diagnosis not present

## 2022-05-11 ENCOUNTER — Other Ambulatory Visit (HOSPITAL_COMMUNITY): Payer: Self-pay | Admitting: Family Medicine

## 2022-05-11 ENCOUNTER — Ambulatory Visit (HOSPITAL_COMMUNITY)
Admission: RE | Admit: 2022-05-11 | Discharge: 2022-05-11 | Disposition: A | Discharge status: Home / Self Care | Payer: Medicare PPO | Source: Ambulatory Visit | Attending: Family Medicine | Admitting: Family Medicine

## 2022-05-11 DIAGNOSIS — G8929 Other chronic pain: Secondary | ICD-10-CM | POA: Insufficient documentation

## 2022-05-11 DIAGNOSIS — M25552 Pain in left hip: Secondary | ICD-10-CM | POA: Diagnosis not present

## 2022-05-11 DIAGNOSIS — G4700 Insomnia, unspecified: Secondary | ICD-10-CM | POA: Diagnosis not present

## 2022-05-11 DIAGNOSIS — Z6825 Body mass index (BMI) 25.0-25.9, adult: Secondary | ICD-10-CM | POA: Diagnosis not present

## 2022-05-11 DIAGNOSIS — E663 Overweight: Secondary | ICD-10-CM | POA: Diagnosis not present

## 2022-05-19 DIAGNOSIS — M9903 Segmental and somatic dysfunction of lumbar region: Secondary | ICD-10-CM | POA: Diagnosis not present

## 2022-05-19 DIAGNOSIS — M5432 Sciatica, left side: Secondary | ICD-10-CM | POA: Diagnosis not present

## 2022-05-21 DIAGNOSIS — L821 Other seborrheic keratosis: Secondary | ICD-10-CM | POA: Diagnosis not present

## 2022-05-21 DIAGNOSIS — I8310 Varicose veins of unspecified lower extremity with inflammation: Secondary | ICD-10-CM | POA: Diagnosis not present

## 2022-05-26 DIAGNOSIS — M9903 Segmental and somatic dysfunction of lumbar region: Secondary | ICD-10-CM | POA: Diagnosis not present

## 2022-05-26 DIAGNOSIS — M5432 Sciatica, left side: Secondary | ICD-10-CM | POA: Diagnosis not present

## 2022-06-23 DIAGNOSIS — M5432 Sciatica, left side: Secondary | ICD-10-CM | POA: Diagnosis not present

## 2022-06-23 DIAGNOSIS — M9903 Segmental and somatic dysfunction of lumbar region: Secondary | ICD-10-CM | POA: Diagnosis not present

## 2022-07-14 DIAGNOSIS — M5432 Sciatica, left side: Secondary | ICD-10-CM | POA: Diagnosis not present

## 2022-07-14 DIAGNOSIS — M9903 Segmental and somatic dysfunction of lumbar region: Secondary | ICD-10-CM | POA: Diagnosis not present

## 2022-08-03 DIAGNOSIS — M9903 Segmental and somatic dysfunction of lumbar region: Secondary | ICD-10-CM | POA: Diagnosis not present

## 2022-08-03 DIAGNOSIS — M5432 Sciatica, left side: Secondary | ICD-10-CM | POA: Diagnosis not present

## 2022-08-24 DIAGNOSIS — M9903 Segmental and somatic dysfunction of lumbar region: Secondary | ICD-10-CM | POA: Diagnosis not present

## 2022-08-24 DIAGNOSIS — M5432 Sciatica, left side: Secondary | ICD-10-CM | POA: Diagnosis not present

## 2022-08-27 DIAGNOSIS — L570 Actinic keratosis: Secondary | ICD-10-CM | POA: Diagnosis not present

## 2022-08-27 DIAGNOSIS — D0439 Carcinoma in situ of skin of other parts of face: Secondary | ICD-10-CM | POA: Diagnosis not present

## 2022-08-27 DIAGNOSIS — X32XXXD Exposure to sunlight, subsequent encounter: Secondary | ICD-10-CM | POA: Diagnosis not present

## 2022-08-31 DIAGNOSIS — L258 Unspecified contact dermatitis due to other agents: Secondary | ICD-10-CM | POA: Diagnosis not present

## 2022-09-15 ENCOUNTER — Ambulatory Visit (INDEPENDENT_AMBULATORY_CARE_PROVIDER_SITE_OTHER): Payer: Medicare PPO

## 2022-09-15 ENCOUNTER — Ambulatory Visit: Payer: Medicare PPO

## 2022-09-15 ENCOUNTER — Ambulatory Visit: Payer: Medicare PPO | Admitting: Podiatry

## 2022-09-15 ENCOUNTER — Encounter: Payer: Self-pay | Admitting: Podiatry

## 2022-09-15 DIAGNOSIS — M19071 Primary osteoarthritis, right ankle and foot: Secondary | ICD-10-CM

## 2022-09-15 DIAGNOSIS — M19079 Primary osteoarthritis, unspecified ankle and foot: Secondary | ICD-10-CM

## 2022-09-16 ENCOUNTER — Ambulatory Visit: Payer: Medicare PPO | Admitting: Podiatry

## 2022-09-16 DIAGNOSIS — M5432 Sciatica, left side: Secondary | ICD-10-CM | POA: Diagnosis not present

## 2022-09-16 DIAGNOSIS — M9903 Segmental and somatic dysfunction of lumbar region: Secondary | ICD-10-CM | POA: Diagnosis not present

## 2022-09-17 NOTE — Progress Notes (Signed)
  Subjective:  Patient ID: Peggy Fletcher, female    DOB: 02-15-1956,  MRN: [id number]  Chief Complaint  Patient presents with   Toe Pain    Pain in right great toe joint - she had bunion surgery years ago in [place]. She has developed pain in that toe over the last month - she may have dropped something on it but doesn't remember specifically    67 y.o. female presents with the above complaint. History confirmed with patient.   Objective:  Physical Exam: warm, good capillary refill, no trophic changes or ulcerative lesions, normal DP and PT pulses, normal sensory exam, and pain and tenderness with range of motion which is limited of the right great toe joint.   Radiographs: Multiple views x-ray of the right foot: Small loose body formation dorsal portion of joint, severe osteoarthrosis with minimal medial joint space remaining Assessment:   1. Arthritis of joint of toe      Plan:  Patient was evaluated and treated and all questions answered.  We reviewed her x-rays.  We discussed that she likely had a cheilectomy for hallux limitus.  The arthritis has progressed and she is beginning to form end-stage arthrosis with loose body formation.  We discussed treatment of this, discussed long-term likely will need arthrodesis of the joint.  For now we discussed pain control with corticosteroid injection for anti-inflammatory relief.  Following sterile prep with Betadine the right first MTPJ was injected with 20 mg of Kenalog and 0.5 cc of 0.5% Marcaine plain.  She tolerated this well.  Return as needed  Return if symptoms worsen or fail to improve.

## 2022-09-30 DIAGNOSIS — Z85828 Personal history of other malignant neoplasm of skin: Secondary | ICD-10-CM | POA: Diagnosis not present

## 2022-09-30 DIAGNOSIS — Z08 Encounter for follow-up examination after completed treatment for malignant neoplasm: Secondary | ICD-10-CM | POA: Diagnosis not present

## 2022-09-30 DIAGNOSIS — L820 Inflamed seborrheic keratosis: Secondary | ICD-10-CM | POA: Diagnosis not present

## 2022-10-06 DIAGNOSIS — M5432 Sciatica, left side: Secondary | ICD-10-CM | POA: Diagnosis not present

## 2022-10-06 DIAGNOSIS — M9903 Segmental and somatic dysfunction of lumbar region: Secondary | ICD-10-CM | POA: Diagnosis not present

## 2022-10-26 ENCOUNTER — Other Ambulatory Visit (HOSPITAL_COMMUNITY): Payer: Self-pay | Admitting: Family Medicine

## 2022-10-26 DIAGNOSIS — Z1231 Encounter for screening mammogram for malignant neoplasm of breast: Secondary | ICD-10-CM

## 2022-10-28 DIAGNOSIS — M5432 Sciatica, left side: Secondary | ICD-10-CM | POA: Diagnosis not present

## 2022-10-28 DIAGNOSIS — M9903 Segmental and somatic dysfunction of lumbar region: Secondary | ICD-10-CM | POA: Diagnosis not present

## 2022-11-05 DIAGNOSIS — L820 Inflamed seborrheic keratosis: Secondary | ICD-10-CM | POA: Diagnosis not present

## 2022-11-18 DIAGNOSIS — M9903 Segmental and somatic dysfunction of lumbar region: Secondary | ICD-10-CM | POA: Diagnosis not present

## 2022-11-18 DIAGNOSIS — M5432 Sciatica, left side: Secondary | ICD-10-CM | POA: Diagnosis not present

## 2022-11-23 ENCOUNTER — Ambulatory Visit (HOSPITAL_COMMUNITY)
Admission: RE | Admit: 2022-11-23 | Discharge: 2022-11-23 | Disposition: A | Discharge status: Home / Self Care | Payer: Medicare PPO | Source: Ambulatory Visit | Attending: Family Medicine | Admitting: Family Medicine

## 2022-11-23 DIAGNOSIS — Z1231 Encounter for screening mammogram for malignant neoplasm of breast: Secondary | ICD-10-CM | POA: Diagnosis not present

## 2022-12-09 DIAGNOSIS — M9903 Segmental and somatic dysfunction of lumbar region: Secondary | ICD-10-CM | POA: Diagnosis not present

## 2022-12-09 DIAGNOSIS — M5432 Sciatica, left side: Secondary | ICD-10-CM | POA: Diagnosis not present

## 2022-12-17 DIAGNOSIS — M5432 Sciatica, left side: Secondary | ICD-10-CM | POA: Diagnosis not present

## 2022-12-17 DIAGNOSIS — M9903 Segmental and somatic dysfunction of lumbar region: Secondary | ICD-10-CM | POA: Diagnosis not present

## 2022-12-30 DIAGNOSIS — Z0001 Encounter for general adult medical examination with abnormal findings: Secondary | ICD-10-CM | POA: Diagnosis not present

## 2022-12-30 DIAGNOSIS — E039 Hypothyroidism, unspecified: Secondary | ICD-10-CM | POA: Diagnosis not present

## 2022-12-30 DIAGNOSIS — M25552 Pain in left hip: Secondary | ICD-10-CM | POA: Diagnosis not present

## 2022-12-30 DIAGNOSIS — E559 Vitamin D deficiency, unspecified: Secondary | ICD-10-CM | POA: Diagnosis not present

## 2022-12-30 DIAGNOSIS — Z6825 Body mass index (BMI) 25.0-25.9, adult: Secondary | ICD-10-CM | POA: Diagnosis not present

## 2022-12-30 DIAGNOSIS — Z1389 Encounter for screening for other disorder: Secondary | ICD-10-CM | POA: Diagnosis not present

## 2022-12-30 DIAGNOSIS — Z1331 Encounter for screening for depression: Secondary | ICD-10-CM | POA: Diagnosis not present

## 2022-12-30 DIAGNOSIS — Z23 Encounter for immunization: Secondary | ICD-10-CM | POA: Diagnosis not present

## 2022-12-30 DIAGNOSIS — E663 Overweight: Secondary | ICD-10-CM | POA: Diagnosis not present

## 2022-12-30 DIAGNOSIS — G4700 Insomnia, unspecified: Secondary | ICD-10-CM | POA: Diagnosis not present

## 2023-01-06 DIAGNOSIS — M5432 Sciatica, left side: Secondary | ICD-10-CM | POA: Diagnosis not present

## 2023-01-06 DIAGNOSIS — M9903 Segmental and somatic dysfunction of lumbar region: Secondary | ICD-10-CM | POA: Diagnosis not present

## 2023-01-13 DIAGNOSIS — M9903 Segmental and somatic dysfunction of lumbar region: Secondary | ICD-10-CM | POA: Diagnosis not present

## 2023-01-13 DIAGNOSIS — M5432 Sciatica, left side: Secondary | ICD-10-CM | POA: Diagnosis not present

## 2023-01-20 DIAGNOSIS — M5432 Sciatica, left side: Secondary | ICD-10-CM | POA: Diagnosis not present

## 2023-01-20 DIAGNOSIS — M9903 Segmental and somatic dysfunction of lumbar region: Secondary | ICD-10-CM | POA: Diagnosis not present

## 2023-02-10 DIAGNOSIS — M5432 Sciatica, left side: Secondary | ICD-10-CM | POA: Diagnosis not present

## 2023-02-10 DIAGNOSIS — M9903 Segmental and somatic dysfunction of lumbar region: Secondary | ICD-10-CM | POA: Diagnosis not present

## 2023-02-24 DIAGNOSIS — M9903 Segmental and somatic dysfunction of lumbar region: Secondary | ICD-10-CM | POA: Diagnosis not present

## 2023-02-24 DIAGNOSIS — M5432 Sciatica, left side: Secondary | ICD-10-CM | POA: Diagnosis not present

## 2023-03-02 DIAGNOSIS — M5432 Sciatica, left side: Secondary | ICD-10-CM | POA: Diagnosis not present

## 2023-03-02 DIAGNOSIS — M9903 Segmental and somatic dysfunction of lumbar region: Secondary | ICD-10-CM | POA: Diagnosis not present

## 2023-03-11 IMAGING — MG MM DIGITAL SCREENING BILAT W/ TOMO AND CAD
8 series · 9 of 24 positions shown · non-contrast
Comparison: Previous exam(s).

CLINICAL DATA: Screening.

EXAM:
DIGITAL SCREENING BILATERAL MAMMOGRAM WITH TOMOSYNTHESIS AND CAD
TECHNIQUE: Bilateral screening digital craniocaudal and mediolateral oblique
mammograms were obtained. Bilateral screening digital breast
tomosynthesis was performed. The images were evaluated with
computer-aided detection.

[L CC synth-2D]
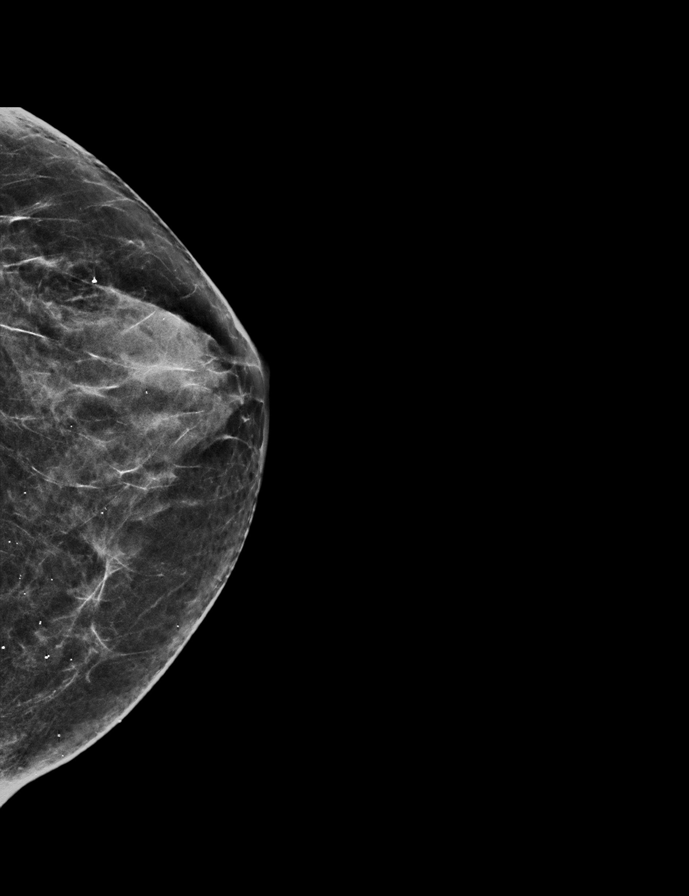

[R MLO synth-2D]
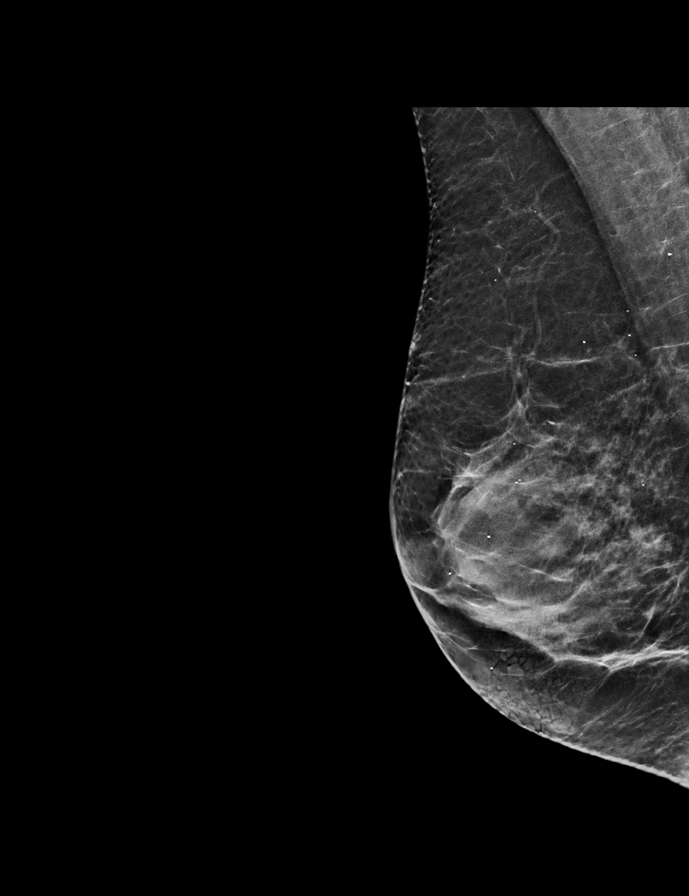

[L MLO synth-2D]
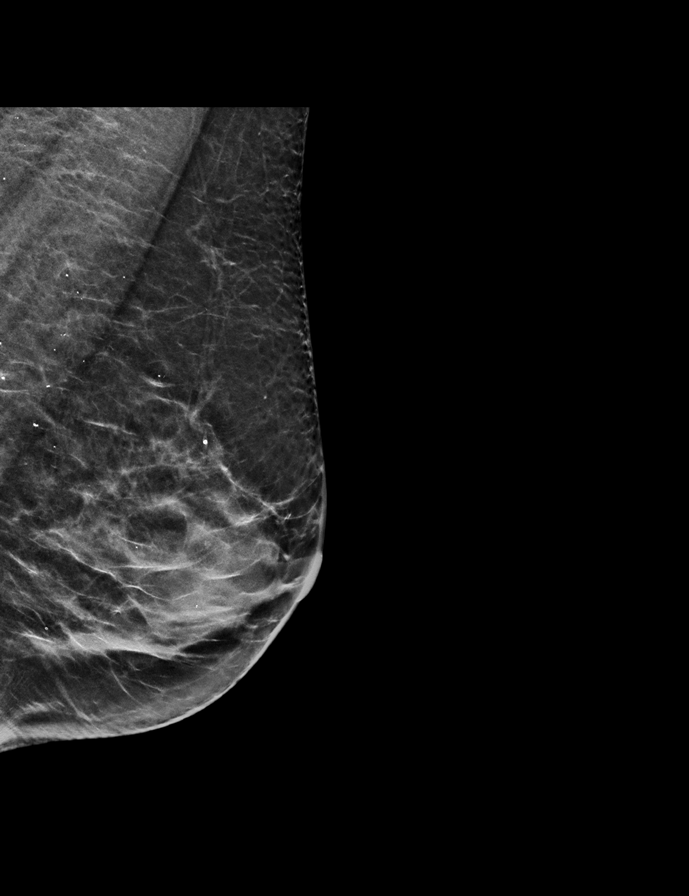

[R CC synth-2D]
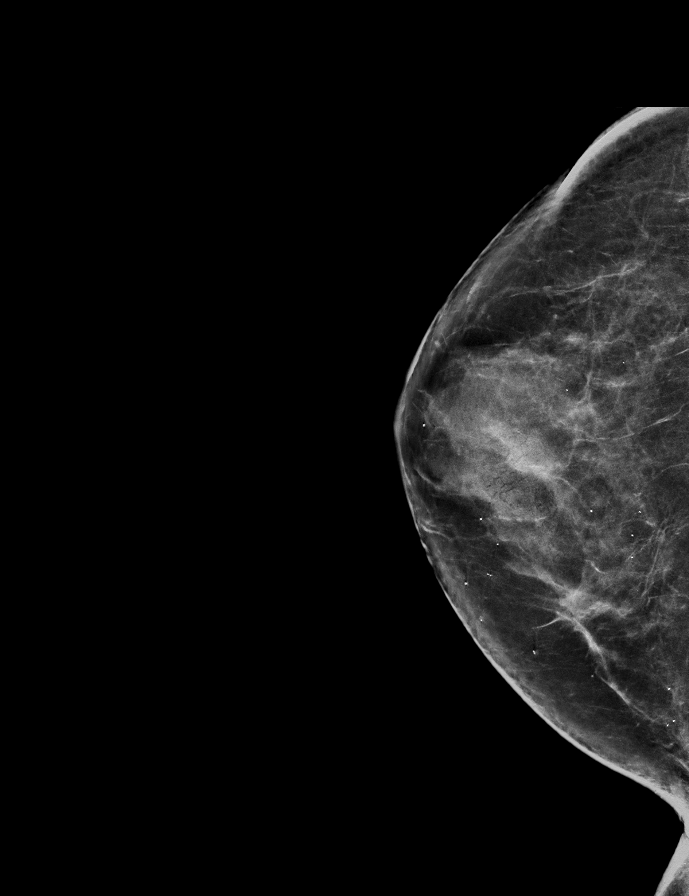

[L CC tomo · 2 of 58 frames shown]
[frame 19/58]
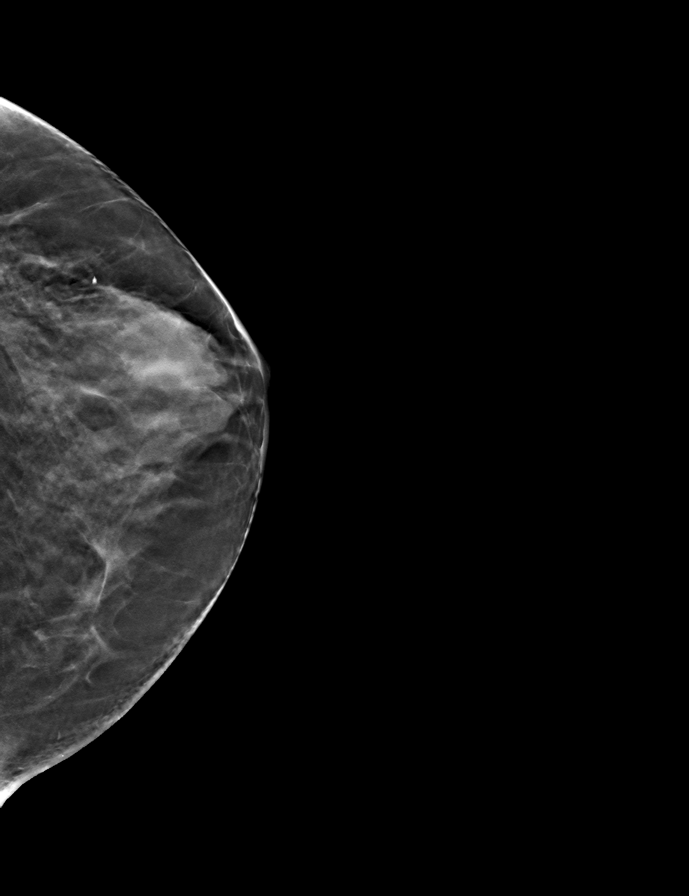
[frame 29/58]
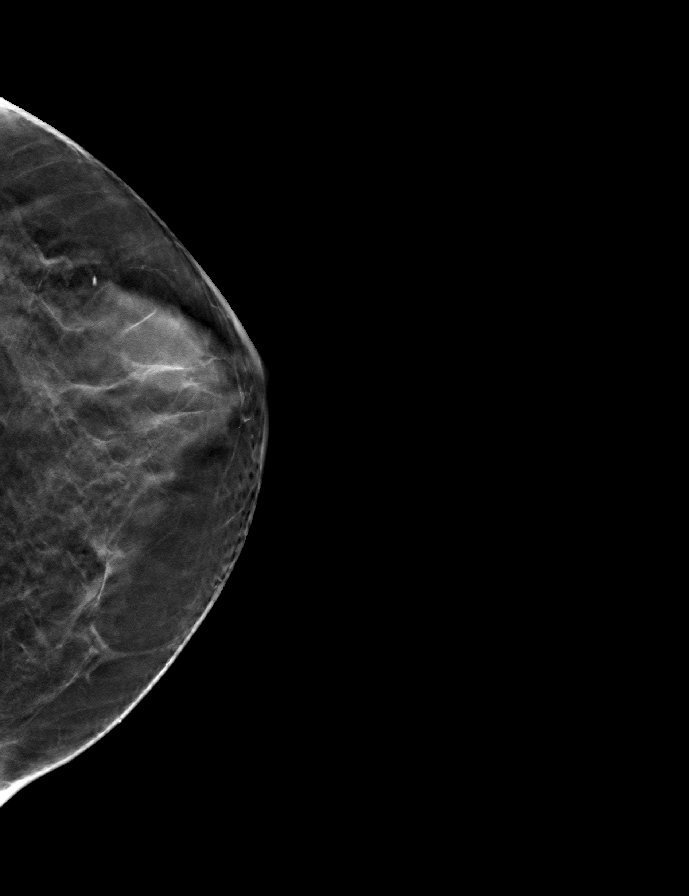

[L MLO tomo · tomo slice 31/61.0]
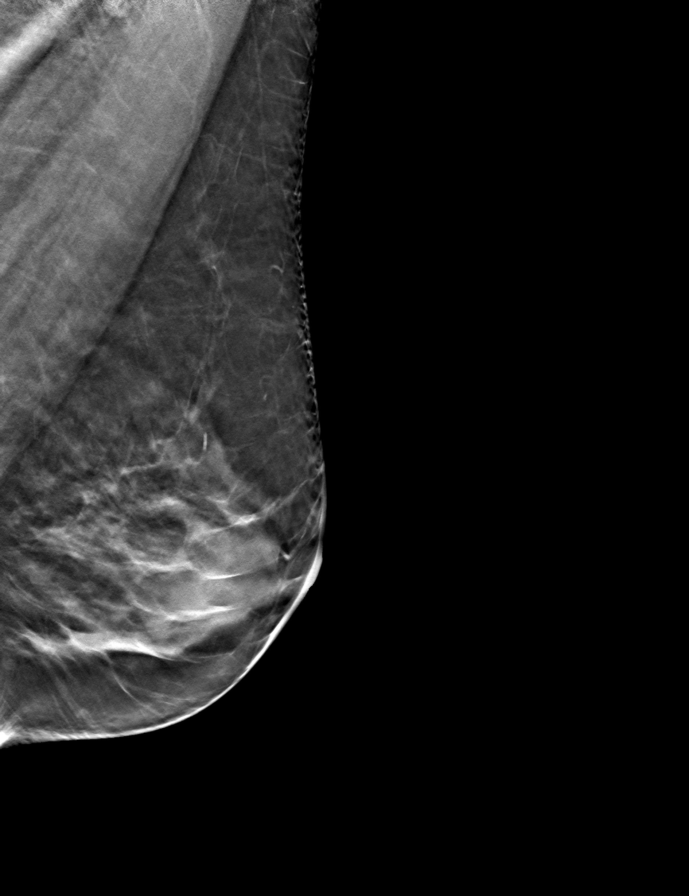

[R MLO tomo · tomo slice 29/57.0]
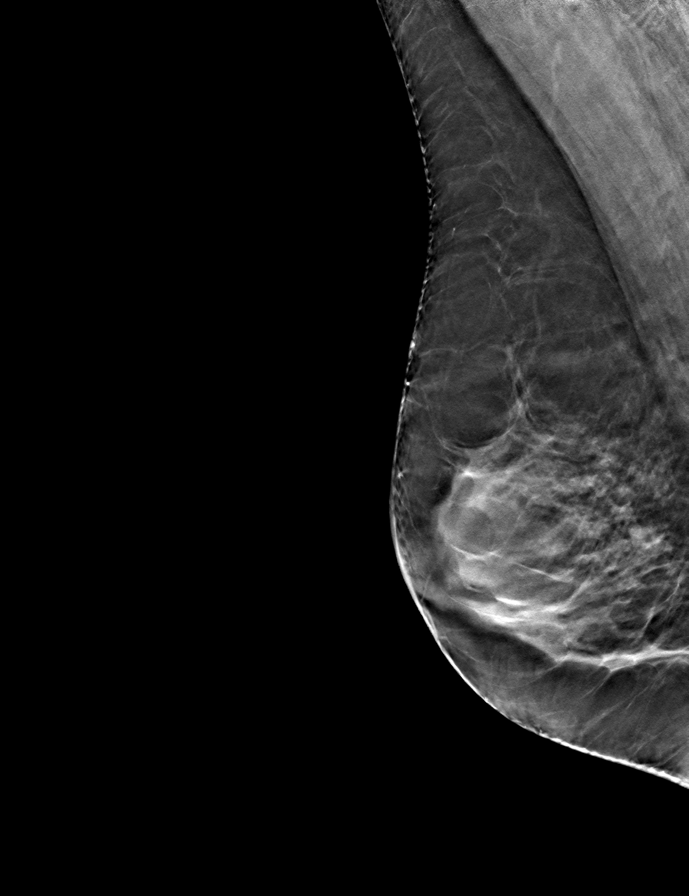

[R CC tomo · tomo slice 35/68.0]
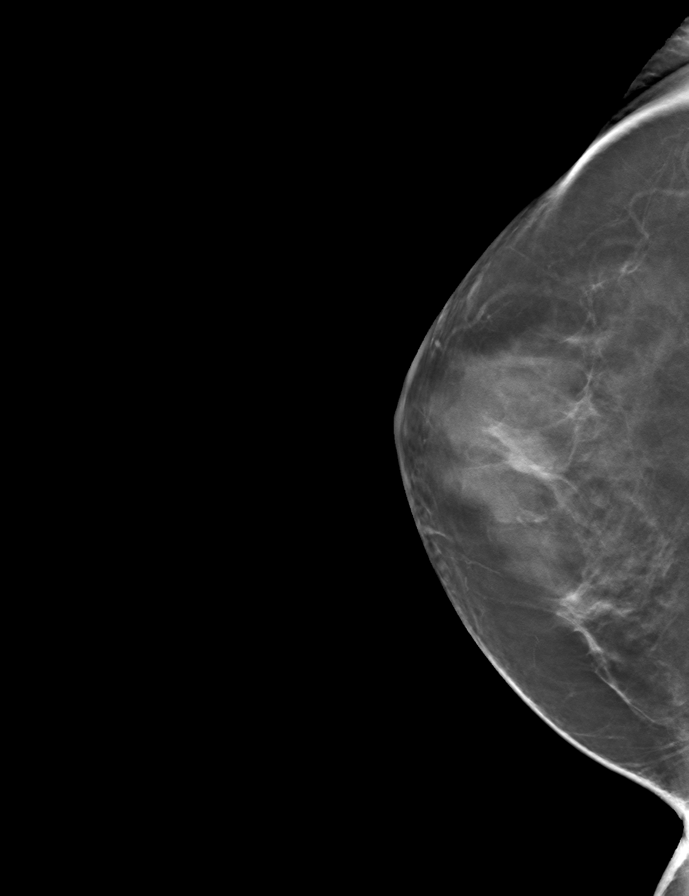

[9 of 24 positions shown; findings below may reference images not displayed]

ACR Breast Density Category c: The breast tissue is heterogeneously
dense, which may obscure small masses.
FINDINGS: There are no findings suspicious for malignancy.
IMPRESSION: No mammographic evidence of malignancy. A result letter of this
screening mammogram will be mailed directly to the patient.

RECOMMENDATION:
Screening mammogram in one year. (Code:Q3-W-BC3)

BI-RADS CATEGORY  1: Negative.

## 2023-03-17 DIAGNOSIS — M9903 Segmental and somatic dysfunction of lumbar region: Secondary | ICD-10-CM | POA: Diagnosis not present

## 2023-03-17 DIAGNOSIS — M5432 Sciatica, left side: Secondary | ICD-10-CM | POA: Diagnosis not present

## 2023-04-06 DIAGNOSIS — M5432 Sciatica, left side: Secondary | ICD-10-CM | POA: Diagnosis not present

## 2023-04-06 DIAGNOSIS — M9903 Segmental and somatic dysfunction of lumbar region: Secondary | ICD-10-CM | POA: Diagnosis not present

## 2023-04-20 DIAGNOSIS — R059 Cough, unspecified: Secondary | ICD-10-CM | POA: Diagnosis not present

## 2023-04-20 DIAGNOSIS — J22 Unspecified acute lower respiratory infection: Secondary | ICD-10-CM | POA: Diagnosis not present

## 2023-04-27 DIAGNOSIS — M5432 Sciatica, left side: Secondary | ICD-10-CM | POA: Diagnosis not present

## 2023-04-27 DIAGNOSIS — M9903 Segmental and somatic dysfunction of lumbar region: Secondary | ICD-10-CM | POA: Diagnosis not present

## 2023-05-04 ENCOUNTER — Ambulatory Visit
Admission: RE | Admit: 2023-05-04 | Discharge: 2023-05-04 | Disposition: A | Discharge status: Home / Self Care | Payer: Medicare PPO | Source: Ambulatory Visit | Attending: Nurse Practitioner | Admitting: Nurse Practitioner

## 2023-05-04 VITALS — BP 135/78 | HR 79 | Temp 97.4°F | Resp 18

## 2023-05-04 DIAGNOSIS — J209 Acute bronchitis, unspecified: Secondary | ICD-10-CM | POA: Diagnosis not present

## 2023-05-04 MED ORDER — ALBUTEROL SULFATE HFA 108 (90 BASE) MCG/ACT IN AERS: 2.0000 | INHALATION_SPRAY | Freq: Four times a day (QID) | RESPIRATORY_TRACT | 0 refills | Status: DC | PRN

## 2023-05-04 MED ORDER — PROMETHAZINE-DM 6.25-15 MG/5ML PO SYRP: 5.0000 mL | ORAL_SOLUTION | Freq: Four times a day (QID) | ORAL | 0 refills | Status: DC | PRN

## 2023-05-04 MED ORDER — IPRATROPIUM-ALBUTEROL 0.5-2.5 (3) MG/3ML IN SOLN
3.0000 mL | Freq: Once | RESPIRATORY_TRACT | Status: AC
  Administered 2023-05-04: 3 mL via RESPIRATORY_TRACT

## 2023-05-04 MED ORDER — PREDNISONE 20 MG PO TABS: 40.0000 mg | ORAL_TABLET | Freq: Every day | ORAL | 0 refills | Status: AC

## 2023-05-04 MED ORDER — METHYLPREDNISOLONE SODIUM SUCC 125 MG IJ SOLR
60.0000 mg | Freq: Once | INTRAMUSCULAR | Status: AC
  Administered 2023-05-04: 60 mg via INTRAMUSCULAR

## 2023-05-04 NOTE — ED Provider Notes (Signed)
 [facility]    CSN: [id number] Arrival date & time: 05/04/23  0915      History   Chief Complaint Chief Complaint  Patient presents with   Cough    Feel like air passages are closing, wheezing - Entered by patient    HPI Peggy Fletcher is a 68 y.o. female.   The history is provided by the patient.   Patient presents for complaints of cough and wheezing that is been present for the past month.  Patient states she was seen at another urgent care and was prescribed azithromycin and Promethazine  DM for her symptoms.  She states that she found minimal relief in that treatment.  She states that she continues to have wheezing, and feels like her air passages are closing.  She denies fever, chills, headache, nasal congestion, runny nose, shortness of breath, difficulty breathing, chest pain, abdominal pain, nausea, vomiting, diarrhea, or rash.  Patient denies prior history of asthma, COPD, or smoking. Past Medical History:  Diagnosis Date   Arthritis    Herpes    Varicose veins of legs     There are no active problems to display for this patient.   Past Surgical History:  Procedure Laterality Date   CHEILECTOMY  03/31/2012   Procedure: CHEILECTOMY;  Surgeon: Morene Donley Anon, DPM;  Location: [facility];  Service: Orthopedics;  Laterality: Right;   COLONOSCOPY WITH PROPOFOL  N/A 10/17/2021   Procedure: COLONOSCOPY WITH PROPOFOL ;  Surgeon: Eartha Angelia Sieving, MD;  Location: [facility];  Service: Gastroenterology;  Laterality: N/A;  730   GANGLION CYST EXCISION  2003   [facility] in [place], [place]    MAINE History   No obstetric history on file.      Home Medications    Prior to Admission medications   Medication Sig Start Date End Date Taking? Authorizing Provider  albuterol  (VENTOLIN  HFA) 108 (90 Base) MCG/ACT inhaler Inhale 2 puffs into the lungs every 6 (six) hours as needed. 05/04/23  Yes Leath-Warren, Etta PARAS, NP  predniSONE   (DELTASONE ) 20 MG tablet Take 2 tablets (40 mg total) by mouth daily with breakfast for 5 days. 05/04/23 05/09/23 Yes Leath-Warren, Etta PARAS, NP  promethazine -dextromethorphan (PROMETHAZINE -DM) 6.25-15 MG/5ML syrup Take 5 mLs by mouth 4 (four) times daily as needed. 05/04/23  Yes Leath-Warren, Etta PARAS, NP  acetaminophen (TYLENOL) 500 MG tablet Take 1,000 mg by mouth every 6 (six) hours as needed for headache, moderate pain or mild pain.    [provider]  aspirin EC 81 MG tablet Take 81 mg by mouth daily.    [provider]  Calcium Carb-Cholecalciferol (CALCIUM 500 +D PO) Take 1 tablet by mouth daily. 25 mcg    [provider]  celecoxib (CELEBREX) 200 MG capsule  06/07/22   [provider]  Cholecalciferol (VITAMIN D3) 50 MCG (2000 UT) capsule Take 2,000 Units by mouth daily.    [provider]  Cyanocobalamin 1500 MCG TBDP Take 3,000 mcg by mouth daily. Gummy    [provider]  fluticasone (CUTIVATE) 0.05 % cream APPLY TO AFFECTED AREA TWICE A DAY 08/31/22   [provider]  levothyroxine (SYNTHROID) 75 MCG tablet Take 75 mcg by mouth daily. 07/12/21   [provider]  melatonin 5 MG TABS Take 2.5 mg by mouth at bedtime.    [provider]  valACYclovir (VALTREX) 1000 MG tablet Take 2,000 mg by mouth daily as needed (outbreak). Take 2000 12 hrs later 04/16/21  [provider]  zolpidem (AMBIEN) 10 MG tablet Take 10 mg by mouth daily. 05/11/22   [provider]    Family History History reviewed. No pertinent family history.  Social History Social History   Tobacco Use   Smoking status: Never  Substance Use Topics   Alcohol use: No   Drug use: No     Allergies   Keflex [cephalexin] and Lincocin [lincomycin hcl]   Review of Systems Review of Systems Per HPI  Physical Exam Triage Vital Signs ED Triage Vitals  Encounter Vitals Group     BP 05/04/23 0937 135/78     Systolic BP  Percentile --      Diastolic BP Percentile --      Pulse Rate 05/04/23 0937 79     Resp 05/04/23 0937 18     Temp 05/04/23 0937 (!) 97.4 F (36.3 C)     Temp Source 05/04/23 0937 Oral     SpO2 05/04/23 0937 98 %     Weight --      Height --      Head Circumference --      Peak Flow --      Pain Score 05/04/23 0938 0     Pain Loc --      Pain Education --      Exclude from Growth Chart --    No data found.  Updated Vital Signs BP 135/78 (BP Location: Right Arm)   Pulse 79   Temp (!) 97.4 F (36.3 C) (Oral)   Resp 18   SpO2 98%   Visual Acuity Right Eye Distance:   Left Eye Distance:   Bilateral Distance:    Right Eye Near:   Left Eye Near:    Bilateral Near:     Physical Exam Vitals and nursing note reviewed.  Constitutional:      General: She is not in acute distress.    Appearance: Normal appearance.  HENT:     Head: Normocephalic.     Right Ear: Tympanic membrane, ear canal and external ear normal.     Left Ear: Tympanic membrane, ear canal and external ear normal.     Nose: Nose normal.     Mouth/Throat:     Mouth: Mucous membranes are moist.     Pharynx: No posterior oropharyngeal erythema.     Comments: Cobblestoning present to posterior oropharynx  Eyes:     Extraocular Movements: Extraocular movements intact.     Conjunctiva/sclera: Conjunctivae normal.     Pupils: Pupils are equal, round, and reactive to light.  Cardiovascular:     Rate and Rhythm: Normal rate and regular rhythm.     Pulses: Normal pulses.     Heart sounds: Normal heart sounds.  Pulmonary:     Effort: Pulmonary effort is normal.     Breath sounds: Examination of the right-upper field reveals wheezing. Examination of the left-upper field reveals wheezing. Examination of the right-lower field reveals wheezing. Examination of the left-lower field reveals wheezing. Wheezing present.     Comments: Post DuoNeb, patient with improved wheezing, wheezing noted in the RUL, RLL, and LLL  feels.  Patient reports improvement in her breathing. Abdominal:     General: Bowel sounds are normal.     Palpations: Abdomen is soft.     Tenderness: There is no abdominal tenderness.  Musculoskeletal:     Cervical back: Normal range of motion.  Lymphadenopathy:     Cervical: No cervical adenopathy.  Skin:  General: Skin is warm and dry.  Neurological:     General: No focal deficit present.     Mental Status: She is alert and oriented to person, place, and time.  Psychiatric:        Mood and Affect: Mood normal.        Behavior: Behavior normal.      UC Treatments / Results  Labs (all labs ordered are listed, but only abnormal results are displayed) Labs Reviewed - No data to display  EKG   Radiology No results found.  Procedures Procedures (including critical care time)  Medications Ordered in UC Medications  ipratropium-albuterol  (DUONEB) 0.5-2.5 (3) MG/3ML nebulizer solution 3 mL (3 mLs Nebulization Given 05/04/23 1000)  methylPREDNISolone  sodium succinate (SOLU-MEDROL ) 125 mg/2 mL injection 60 mg (60 mg Intramuscular Given 05/04/23 0958)    Initial Impression / Assessment and Plan / UC Course  I have reviewed the triage vital signs and the nursing notes.  Pertinent labs & imaging results that were available during my care of the patient were reviewed by me and considered in my medical decision making (see chart for details).  Preliminary exam with patient with expiratory wheezing noted in all lung fields.  Suspect symptoms have transitioned into bronchitis.  Unable to perform chest x-ray in house due to staffing availability.  DuoNeb was administered, improvement of patient's wheezing noted post neb treatment.  Solu-Medrol  60 mg IM was also administered.  Will start patient on prednisone  40 mg for the next 5 days, and albuterol  inhaler was prescribed for wheezing, and Promethazine  DM for the cough.  Supportive care recommendations were provided to the patient to  include fluids, rest, over-the-counter analgesics, and use of a humidifier at nighttime during sleep.  Discussion with patient regarding follow-up.  Patient was advised that if symptoms fail to improve, it is recommended that she have a chest x-ray if she follows up in this clinic or with her PCP.  Patient was in agreement with this plan of care and verbalized understanding.  All questions were answered.  Patient stable for discharge.   Final Clinical Impressions(s) / UC Diagnoses   Final diagnoses:  Acute bronchitis, unspecified organism     Discharge Instructions      You were given an injection of Solu-Medrol  60 mg today.  Start the prednisone  on 05/05/2023. Take medication as prescribed. Increase fluids and allow for plenty of rest. May take over-the-counter Tylenol or ibuprofen as needed for pain, fever, or general discomfort. Recommend using a humidifier in your bedroom at nighttime during sleep and sleeping elevated on pillows while symptoms persist. As discussed, if you continue to have a persistent, nagging cough, but generally feel well, increase your fluid intake, and use over-the-counter cough medications and cough drops.  Follow-up immediately if you experience new symptoms such as fever, chills, shortness of breath, difficulty breathing, or continued wheezing. Follow-up as needed.     ED Prescriptions     Medication Sig Dispense Auth. Provider   predniSONE  (DELTASONE ) 20 MG tablet Take 2 tablets (40 mg total) by mouth daily with breakfast for 5 days. 10 tablet Leath-Warren, Etta PARAS, NP   albuterol  (VENTOLIN  HFA) 108 (90 Base) MCG/ACT inhaler Inhale 2 puffs into the lungs every 6 (six) hours as needed. 8 g Leath-Warren, Etta PARAS, NP   promethazine -dextromethorphan (PROMETHAZINE -DM) 6.25-15 MG/5ML syrup Take 5 mLs by mouth 4 (four) times daily as needed. 118 mL Leath-Warren, Etta PARAS, NP      PDMP not reviewed this encounter.  Gilmer Etta PARAS,  NP 05/04/23 1028

## 2023-05-04 NOTE — Discharge Instructions (Addendum)
 You were given an injection of Solu-Medrol  60 mg today.  Start the prednisone  on 05/05/2023. Take medication as prescribed. Increase fluids and allow for plenty of rest. May take over-the-counter Tylenol or ibuprofen as needed for pain, fever, or general discomfort. Recommend using a humidifier in your bedroom at nighttime during sleep and sleeping elevated on pillows while symptoms persist. As discussed, if you continue to have a persistent, nagging cough, but generally feel well, increase your fluid intake, and use over-the-counter cough medications and cough drops.  Follow-up immediately if you experience new symptoms such as fever, chills, shortness of breath, difficulty breathing, or continued wheezing. Follow-up as needed.

## 2023-05-04 NOTE — ED Triage Notes (Signed)
 Pt reports she has sneezing and coughing x 4 weeks. No improvement

## 2023-05-18 DIAGNOSIS — M9903 Segmental and somatic dysfunction of lumbar region: Secondary | ICD-10-CM | POA: Diagnosis not present

## 2023-05-18 DIAGNOSIS — M5432 Sciatica, left side: Secondary | ICD-10-CM | POA: Diagnosis not present

## 2023-06-02 DIAGNOSIS — M5432 Sciatica, left side: Secondary | ICD-10-CM | POA: Diagnosis not present

## 2023-06-02 DIAGNOSIS — M9903 Segmental and somatic dysfunction of lumbar region: Secondary | ICD-10-CM | POA: Diagnosis not present

## 2023-06-03 ENCOUNTER — Ambulatory Visit (HOSPITAL_COMMUNITY)
Admission: RE | Admit: 2023-06-03 | Discharge: 2023-06-03 | Disposition: A | Discharge status: Home / Self Care | Payer: Medicare PPO | Source: Ambulatory Visit | Attending: Family Medicine | Admitting: Family Medicine

## 2023-06-03 ENCOUNTER — Other Ambulatory Visit (HOSPITAL_COMMUNITY): Payer: Self-pay | Admitting: Family Medicine

## 2023-06-03 DIAGNOSIS — J984 Other disorders of lung: Secondary | ICD-10-CM | POA: Diagnosis not present

## 2023-06-03 DIAGNOSIS — R058 Other specified cough: Secondary | ICD-10-CM

## 2023-06-03 DIAGNOSIS — R062 Wheezing: Secondary | ICD-10-CM | POA: Diagnosis not present

## 2023-06-03 DIAGNOSIS — R059 Cough, unspecified: Secondary | ICD-10-CM | POA: Diagnosis not present

## 2023-06-03 DIAGNOSIS — Z6826 Body mass index (BMI) 26.0-26.9, adult: Secondary | ICD-10-CM | POA: Diagnosis not present

## 2023-06-03 DIAGNOSIS — M542 Cervicalgia: Secondary | ICD-10-CM | POA: Diagnosis not present

## 2023-06-04 ENCOUNTER — Other Ambulatory Visit (HOSPITAL_COMMUNITY): Payer: Self-pay | Admitting: Family Medicine

## 2023-06-04 DIAGNOSIS — R058 Other specified cough: Secondary | ICD-10-CM

## 2023-06-08 DIAGNOSIS — M5432 Sciatica, left side: Secondary | ICD-10-CM | POA: Diagnosis not present

## 2023-06-08 DIAGNOSIS — M9903 Segmental and somatic dysfunction of lumbar region: Secondary | ICD-10-CM | POA: Diagnosis not present

## 2023-06-16 DIAGNOSIS — M9903 Segmental and somatic dysfunction of lumbar region: Secondary | ICD-10-CM | POA: Diagnosis not present

## 2023-06-16 DIAGNOSIS — M5432 Sciatica, left side: Secondary | ICD-10-CM | POA: Diagnosis not present

## 2023-06-21 ENCOUNTER — Other Ambulatory Visit (HOSPITAL_COMMUNITY): Payer: Self-pay | Admitting: Family Medicine

## 2023-06-21 DIAGNOSIS — M5030 Other cervical disc degeneration, unspecified cervical region: Secondary | ICD-10-CM | POA: Diagnosis not present

## 2023-06-21 DIAGNOSIS — M542 Cervicalgia: Secondary | ICD-10-CM | POA: Diagnosis not present

## 2023-06-21 DIAGNOSIS — Z6826 Body mass index (BMI) 26.0-26.9, adult: Secondary | ICD-10-CM | POA: Diagnosis not present

## 2023-06-21 DIAGNOSIS — E663 Overweight: Secondary | ICD-10-CM | POA: Diagnosis not present

## 2023-06-23 ENCOUNTER — Encounter (HOSPITAL_COMMUNITY): Payer: Self-pay

## 2023-06-23 ENCOUNTER — Emergency Department (HOSPITAL_COMMUNITY)
Admission: EM | Admit: 2023-06-23 | Discharge: 2023-06-24 | Disposition: A | Discharge status: Home / Self Care | Attending: Emergency Medicine | Admitting: Emergency Medicine

## 2023-06-23 ENCOUNTER — Emergency Department (HOSPITAL_COMMUNITY)

## 2023-06-23 ENCOUNTER — Other Ambulatory Visit: Payer: Self-pay

## 2023-06-23 DIAGNOSIS — M47812 Spondylosis without myelopathy or radiculopathy, cervical region: Secondary | ICD-10-CM | POA: Diagnosis not present

## 2023-06-23 DIAGNOSIS — M542 Cervicalgia: Secondary | ICD-10-CM | POA: Diagnosis not present

## 2023-06-23 NOTE — ED Triage Notes (Signed)
 Pt arrived via POV c/o posterior right neck pain. Pt reports seeing her PCP and being prescribed steroids, w/o relief. Pt reports seeing chiropractor w/o relief. Pt reports trying OTC medications, heat and ice packs w/o relief.

## 2023-06-24 MED ORDER — LIDOCAINE 5 % EX PTCH: 1.0000 | MEDICATED_PATCH | CUTANEOUS | 0 refills | Status: DC

## 2023-06-24 MED ORDER — KETOROLAC TROMETHAMINE 15 MG/ML IJ SOLN
15.0000 mg | Freq: Once | INTRAMUSCULAR | Status: AC
  Administered 2023-06-24: 15 mg via INTRAMUSCULAR
  Filled 2023-06-24: qty 1

## 2023-06-24 MED ORDER — METHOCARBAMOL 500 MG PO TABS: 500.0000 mg | ORAL_TABLET | Freq: Three times a day (TID) | ORAL | 0 refills | Status: DC | PRN

## 2023-06-24 MED ORDER — METHOCARBAMOL 500 MG PO TABS
1000.0000 mg | ORAL_TABLET | Freq: Once | ORAL | Status: AC
  Administered 2023-06-24: 1000 mg via ORAL
  Filled 2023-06-24: qty 2

## 2023-06-24 MED ORDER — LIDOCAINE 5 % EX PTCH
1.0000 | MEDICATED_PATCH | CUTANEOUS | Status: DC
  Administered 2023-06-24: 1 via TRANSDERMAL
  Filled 2023-06-24: qty 1

## 2023-06-24 NOTE — Discharge Instructions (Signed)
 You were evaluated in the Emergency Department and after careful evaluation, we did not find any emergent condition requiring admission or further testing in the hospital.  Your exam/testing today is overall reassuring.  Symptoms may be due to a pinched nerve in the neck.  Recommend continued use of your celecoxib at home twice a day, Tylenol 1000 mg every 4-6 hours.  Can use the Robaxin muscle relaxer for more significant pain.  Also recommend the Lidoderm numbing patches daily.  Keep your follow-up with your primary care doctor and your MRI appointment.  Depending on these results you may benefit from follow-up with the spine experts.  Please return to the Emergency Department if you experience any worsening of your condition.   Thank you for allowing [place] to be a part of your care.

## 2023-06-24 NOTE — ED Provider Notes (Signed)
 [facility]-EMERGENCY DEPT [facility] Emergency Department Provider Note MRN:  [id number]  Arrival date & time: 06/24/23     Chief Complaint   Neck Pain   History of Present Illness   Peggy Fletcher is a 68 y.o. year-old female with no pertinent past medical history presenting to the ED with chief complaint of neck pain.  Pain in the right posterior neck with radiation to the right shoulder and at times down into the right arm and fingers.  Pain persistent for several weeks, seems to be getting worse despite multiple interventions.  Has been on a steroid burst, has been seen by her primary care a few times.  Pain was worse this evening, here to try to get some relief.  Denies numbness or weakness to the arms or legs, no bowel or bladder dysfunction, no fever, no falls or trauma.  Review of Systems  A thorough review of systems was obtained and all systems are negative except as noted in the HPI and PMH.   Patient's Health History    Past Medical History:  Diagnosis Date   Arthritis    Herpes    Varicose veins of legs     Past Surgical History:  Procedure Laterality Date   CHEILECTOMY  03/31/2012   Procedure: CHEILECTOMY;  Surgeon: Dallas Schimke, DPM;  Location: [facility] ORS;  Service: Orthopedics;  Laterality: Right;   COLONOSCOPY WITH PROPOFOL N/A 10/17/2021   Procedure: COLONOSCOPY WITH PROPOFOL;  Surgeon: Dolores Frame, MD;  Location: [facility] ENDO SUITE;  Service: Gastroenterology;  Laterality: N/A;  730   GANGLION CYST EXCISION  2003   [facility] in [place], [place]    History reviewed. No pertinent family history.  Social History   Socioeconomic History   Marital status: Married    Spouse name: Not on file   Number of children: Not on file   Years of education: Not on file   Highest education level: Not on file  Occupational History   Not on file  Tobacco Use   Smoking status: Never   Smokeless tobacco: Not on file  Vaping Use   Vaping status:  Never Used  Substance and Sexual Activity   Alcohol use: No   Drug use: No   Sexual activity: Yes    Birth control/protection: Post-menopausal  Other Topics Concern   Not on file  Social History Narrative   Not on file   Social Drivers of Health   Financial Resource Strain: Not on file  Food Insecurity: Not on file  Transportation Needs: Not on file  Physical Activity: Not on file  Stress: Not on file  Social Connections: Not on file  Intimate Partner Violence: Not on file     Physical Exam   Vitals:   06/23/23 1748 06/23/23 2338  BP: (!) 151/101 124/83  Pulse: 72 72  Resp: 16 18  Temp: 98.6 F (37 C) 98.6 F (37 C)  SpO2: 100% 95%    CONSTITUTIONAL: Well-appearing, NAD NEURO/PSYCH:  Alert and oriented x 3, no focal deficits EYES:  eyes equal and reactive ENT/NECK:  no LAD, no JVD CARDIO: Regular rate, well-perfused, normal S1 and S2 PULM:  CTAB no wheezing or rhonchi GI/GU:  non-distended, non-tender MSK/SPINE:  No gross deformities, no edema SKIN:  no rash, atraumatic   *Additional and/or pertinent findings included in MDM below  Diagnostic and Interventional Summary    EKG Interpretation Date/Time:    Ventricular Rate:    PR Interval:  QRS Duration:    QT Interval:    QTC Calculation:   R Axis:      Text Interpretation:         Labs Reviewed - No data to display  DG Cervical Spine Complete  Final Result      Medications  methocarbamol (ROBAXIN) tablet 1,000 mg (has no administration in time range)  lidocaine (LIDODERM) 5 % 1 patch (has no administration in time range)  ketorolac (TORADOL) 15 MG/ML injection 15 mg (has no administration in time range)     Procedures  /  Critical Care Procedures  ED Course and Medical Decision Making  Initial Impression and Ddx The description of the pain with radiation is suspicious for cervical radiculopathy.  She has no signs or symptoms to suggest myelopathy.  No trauma to warrant imaging.  Overall  well-appearing with normal vital signs, preserved range of motion of the neck.  Past medical/surgical history that increases complexity of ED encounter: None  Interpretation of Diagnostics I personally reviewed the cervical x-ray and my interpretation is as follows: No acute concerns, evidence of arthritis    Patient Reassessment and Ultimate Disposition/Management     Without emergent process patient is appropriate for discharge with augmented pain regimen, she has follow-up with her primary care doctor as well as an MRI already scheduled.  Strict return precautions.  Patient management required discussion with the following services or consulting groups:  None  Complexity of Problems Addressed Acute illness or injury that poses threat of life of bodily function  Additional Data Reviewed and Analyzed Further history obtained from: Further history from spouse/family member  Additional Factors Impacting ED Encounter Risk Prescriptions  Elmer Sow. Pilar Plate, MD [facility] Emergency Medicine [facility] [email or web address]  Final Clinical Impressions(s) / ED Diagnoses     ICD-10-CM   1. Neck pain  M54.2       ED Discharge Orders          Ordered    methocarbamol (ROBAXIN) 500 MG tablet  Every 8 hours PRN        06/24/23 0024    lidocaine (LIDODERM) 5 %  Every 24 hours        06/24/23 0024             Discharge Instructions Discussed with and Provided to Patient:    Discharge Instructions      You were evaluated in the Emergency Department and after careful evaluation, we did not find any emergent condition requiring admission or further testing in the hospital.  Your exam/testing today is overall reassuring.  Symptoms may be due to a pinched nerve in the neck.  Recommend continued use of your celecoxib at home twice a day, Tylenol 1000 mg every 4-6 hours.  Can use the Robaxin muscle relaxer for more significant pain.  Also recommend the Lidoderm  numbing patches daily.  Keep your follow-up with your primary care doctor and your MRI appointment.  Depending on these results you may benefit from follow-up with the spine experts.  Please return to the Emergency Department if you experience any worsening of your condition.   Thank you for allowing [place] to be a part of your care.      Sabas Sous, MD 06/24/23 [phone]

## 2023-06-30 ENCOUNTER — Ambulatory Visit (HOSPITAL_COMMUNITY)
Admission: RE | Admit: 2023-06-30 | Discharge: 2023-06-30 | Disposition: A | Discharge status: Home / Self Care | Source: Ambulatory Visit | Attending: Family Medicine | Admitting: Family Medicine

## 2023-06-30 DIAGNOSIS — M5030 Other cervical disc degeneration, unspecified cervical region: Secondary | ICD-10-CM | POA: Diagnosis not present

## 2023-06-30 DIAGNOSIS — M4802 Spinal stenosis, cervical region: Secondary | ICD-10-CM | POA: Diagnosis not present

## 2023-06-30 DIAGNOSIS — R059 Cough, unspecified: Secondary | ICD-10-CM | POA: Diagnosis not present

## 2023-06-30 DIAGNOSIS — R058 Other specified cough: Secondary | ICD-10-CM | POA: Insufficient documentation

## 2023-06-30 DIAGNOSIS — M50222 Other cervical disc displacement at C5-C6 level: Secondary | ICD-10-CM | POA: Diagnosis not present

## 2023-06-30 DIAGNOSIS — R062 Wheezing: Secondary | ICD-10-CM | POA: Diagnosis not present

## 2023-06-30 DIAGNOSIS — R918 Other nonspecific abnormal finding of lung field: Secondary | ICD-10-CM | POA: Diagnosis not present

## 2023-06-30 DIAGNOSIS — M47812 Spondylosis without myelopathy or radiculopathy, cervical region: Secondary | ICD-10-CM | POA: Diagnosis not present

## 2023-07-05 DIAGNOSIS — M5432 Sciatica, left side: Secondary | ICD-10-CM | POA: Diagnosis not present

## 2023-07-05 DIAGNOSIS — M9903 Segmental and somatic dysfunction of lumbar region: Secondary | ICD-10-CM | POA: Diagnosis not present

## 2023-07-08 DIAGNOSIS — M9903 Segmental and somatic dysfunction of lumbar region: Secondary | ICD-10-CM | POA: Diagnosis not present

## 2023-07-08 DIAGNOSIS — M5432 Sciatica, left side: Secondary | ICD-10-CM | POA: Diagnosis not present

## 2023-07-12 DIAGNOSIS — M9903 Segmental and somatic dysfunction of lumbar region: Secondary | ICD-10-CM | POA: Diagnosis not present

## 2023-07-12 DIAGNOSIS — M5432 Sciatica, left side: Secondary | ICD-10-CM | POA: Diagnosis not present

## 2023-07-13 ENCOUNTER — Other Ambulatory Visit (HOSPITAL_COMMUNITY): Payer: Self-pay | Admitting: Family Medicine

## 2023-07-13 DIAGNOSIS — R918 Other nonspecific abnormal finding of lung field: Secondary | ICD-10-CM

## 2023-07-15 DIAGNOSIS — M9903 Segmental and somatic dysfunction of lumbar region: Secondary | ICD-10-CM | POA: Diagnosis not present

## 2023-07-15 DIAGNOSIS — M5432 Sciatica, left side: Secondary | ICD-10-CM | POA: Diagnosis not present

## 2023-07-19 DIAGNOSIS — M5432 Sciatica, left side: Secondary | ICD-10-CM | POA: Diagnosis not present

## 2023-07-19 DIAGNOSIS — M9903 Segmental and somatic dysfunction of lumbar region: Secondary | ICD-10-CM | POA: Diagnosis not present

## 2023-07-20 ENCOUNTER — Ambulatory Visit (HOSPITAL_COMMUNITY)
Admission: RE | Admit: 2023-07-20 | Discharge: 2023-07-20 | Disposition: A | Discharge status: Home / Self Care | Source: Ambulatory Visit | Attending: Family Medicine | Admitting: Family Medicine

## 2023-07-20 ENCOUNTER — Other Ambulatory Visit (HOSPITAL_COMMUNITY)

## 2023-07-20 DIAGNOSIS — R918 Other nonspecific abnormal finding of lung field: Secondary | ICD-10-CM | POA: Insufficient documentation

## 2023-07-22 DIAGNOSIS — M5432 Sciatica, left side: Secondary | ICD-10-CM | POA: Diagnosis not present

## 2023-07-22 DIAGNOSIS — M9903 Segmental and somatic dysfunction of lumbar region: Secondary | ICD-10-CM | POA: Diagnosis not present

## 2023-07-26 DIAGNOSIS — M9903 Segmental and somatic dysfunction of lumbar region: Secondary | ICD-10-CM | POA: Diagnosis not present

## 2023-07-26 DIAGNOSIS — M5432 Sciatica, left side: Secondary | ICD-10-CM | POA: Diagnosis not present

## 2023-08-02 DIAGNOSIS — M9903 Segmental and somatic dysfunction of lumbar region: Secondary | ICD-10-CM | POA: Diagnosis not present

## 2023-08-02 DIAGNOSIS — M5432 Sciatica, left side: Secondary | ICD-10-CM | POA: Diagnosis not present

## 2023-08-05 DIAGNOSIS — M9903 Segmental and somatic dysfunction of lumbar region: Secondary | ICD-10-CM | POA: Diagnosis not present

## 2023-08-05 DIAGNOSIS — M5432 Sciatica, left side: Secondary | ICD-10-CM | POA: Diagnosis not present

## 2023-08-11 DIAGNOSIS — M9903 Segmental and somatic dysfunction of lumbar region: Secondary | ICD-10-CM | POA: Diagnosis not present

## 2023-08-11 DIAGNOSIS — M5432 Sciatica, left side: Secondary | ICD-10-CM | POA: Diagnosis not present

## 2023-08-13 DIAGNOSIS — M47812 Spondylosis without myelopathy or radiculopathy, cervical region: Secondary | ICD-10-CM | POA: Diagnosis not present

## 2023-08-16 DIAGNOSIS — M542 Cervicalgia: Secondary | ICD-10-CM | POA: Diagnosis not present

## 2023-08-17 DIAGNOSIS — M9903 Segmental and somatic dysfunction of lumbar region: Secondary | ICD-10-CM | POA: Diagnosis not present

## 2023-08-17 DIAGNOSIS — M5432 Sciatica, left side: Secondary | ICD-10-CM | POA: Diagnosis not present

## 2023-08-23 DIAGNOSIS — M542 Cervicalgia: Secondary | ICD-10-CM | POA: Diagnosis not present

## 2023-08-25 DIAGNOSIS — M9903 Segmental and somatic dysfunction of lumbar region: Secondary | ICD-10-CM | POA: Diagnosis not present

## 2023-08-25 DIAGNOSIS — M5432 Sciatica, left side: Secondary | ICD-10-CM | POA: Diagnosis not present

## 2023-08-30 DIAGNOSIS — M542 Cervicalgia: Secondary | ICD-10-CM | POA: Diagnosis not present

## 2023-09-08 DIAGNOSIS — M9903 Segmental and somatic dysfunction of lumbar region: Secondary | ICD-10-CM | POA: Diagnosis not present

## 2023-09-08 DIAGNOSIS — M5432 Sciatica, left side: Secondary | ICD-10-CM | POA: Diagnosis not present

## 2023-09-14 DIAGNOSIS — M5432 Sciatica, left side: Secondary | ICD-10-CM | POA: Diagnosis not present

## 2023-09-14 DIAGNOSIS — M9903 Segmental and somatic dysfunction of lumbar region: Secondary | ICD-10-CM | POA: Diagnosis not present

## 2023-09-22 DIAGNOSIS — M9903 Segmental and somatic dysfunction of lumbar region: Secondary | ICD-10-CM | POA: Diagnosis not present

## 2023-09-22 DIAGNOSIS — M5432 Sciatica, left side: Secondary | ICD-10-CM | POA: Diagnosis not present

## 2023-10-06 DIAGNOSIS — M5432 Sciatica, left side: Secondary | ICD-10-CM | POA: Diagnosis not present

## 2023-10-06 DIAGNOSIS — M9903 Segmental and somatic dysfunction of lumbar region: Secondary | ICD-10-CM | POA: Diagnosis not present

## 2023-10-11 ENCOUNTER — Other Ambulatory Visit (HOSPITAL_COMMUNITY): Payer: Self-pay | Admitting: Family Medicine

## 2023-10-11 DIAGNOSIS — Z1231 Encounter for screening mammogram for malignant neoplasm of breast: Secondary | ICD-10-CM

## 2023-10-26 DIAGNOSIS — M5432 Sciatica, left side: Secondary | ICD-10-CM | POA: Diagnosis not present

## 2023-10-26 DIAGNOSIS — M9903 Segmental and somatic dysfunction of lumbar region: Secondary | ICD-10-CM | POA: Diagnosis not present

## 2023-11-17 DIAGNOSIS — M5432 Sciatica, left side: Secondary | ICD-10-CM | POA: Diagnosis not present

## 2023-11-17 DIAGNOSIS — M9903 Segmental and somatic dysfunction of lumbar region: Secondary | ICD-10-CM | POA: Diagnosis not present

## 2023-11-25 ENCOUNTER — Ambulatory Visit: Admitting: Podiatry

## 2023-11-25 VITALS — Ht 67.0 in | Wt 159.0 lb

## 2023-11-25 DIAGNOSIS — M19071 Primary osteoarthritis, right ankle and foot: Secondary | ICD-10-CM

## 2023-11-25 DIAGNOSIS — M19079 Primary osteoarthritis, unspecified ankle and foot: Secondary | ICD-10-CM

## 2023-11-25 NOTE — Progress Notes (Signed)
  Subjective:  Patient ID: Peggy Fletcher, female    DOB: [birth year],  MRN: [id number]  Chief Complaint  Patient presents with   Toe Pain    RM 10 Patient is here to follow-up on right toe pain. Patient received shot in May 2024 and would like another injection.    68 y.o. female presents with the above complaint. History confirmed with patient.  Doing well late last injection lasted over a year  Objective:  Physical Exam: warm, good capillary refill, no trophic changes or ulcerative lesions, normal DP and PT pulses, normal sensory exam, and pain and tenderness with range of motion which is limited of the right great toe joint.   Radiographs: Multiple views x-ray of the right foot: Small loose body formation dorsal portion of joint, severe osteoarthrosis with minimal medial joint space remaining Assessment:   1. Arthritis of joint of toe       Plan:  Patient was evaluated and treated and all questions answered.  Has done quite well after the last injection and we plan to continue with corticosteroid injection for anti-inflammatory relief.  Following sterile prep with Betadine and verbal consent the right first MTPJ was injected with 20 mg of Kenalog and 0.5 cc of 0.5% Marcaine  plain utilizing ethyl chloride spray as a topical anesthetic.  She tolerated this well.  Return as needed  Return if symptoms worsen or fail to improve.

## 2023-11-26 ENCOUNTER — Encounter (HOSPITAL_COMMUNITY): Payer: Self-pay

## 2023-11-26 ENCOUNTER — Ambulatory Visit (HOSPITAL_COMMUNITY)
Admission: RE | Admit: 2023-11-26 | Discharge: 2023-11-26 | Disposition: A | Discharge status: Home / Self Care | Source: Ambulatory Visit | Attending: Family Medicine | Admitting: Family Medicine

## 2023-11-26 DIAGNOSIS — Z1231 Encounter for screening mammogram for malignant neoplasm of breast: Secondary | ICD-10-CM | POA: Diagnosis not present

## 2023-12-08 DIAGNOSIS — M9903 Segmental and somatic dysfunction of lumbar region: Secondary | ICD-10-CM | POA: Diagnosis not present

## 2023-12-08 DIAGNOSIS — M5432 Sciatica, left side: Secondary | ICD-10-CM | POA: Diagnosis not present

## 2023-12-29 DIAGNOSIS — M5432 Sciatica, left side: Secondary | ICD-10-CM | POA: Diagnosis not present

## 2023-12-29 DIAGNOSIS — M9903 Segmental and somatic dysfunction of lumbar region: Secondary | ICD-10-CM | POA: Diagnosis not present

## 2023-12-31 DIAGNOSIS — Z0000 Encounter for general adult medical examination without abnormal findings: Secondary | ICD-10-CM | POA: Diagnosis not present

## 2023-12-31 DIAGNOSIS — Z0001 Encounter for general adult medical examination with abnormal findings: Secondary | ICD-10-CM | POA: Diagnosis not present

## 2023-12-31 DIAGNOSIS — R918 Other nonspecific abnormal finding of lung field: Secondary | ICD-10-CM | POA: Diagnosis not present

## 2023-12-31 DIAGNOSIS — E039 Hypothyroidism, unspecified: Secondary | ICD-10-CM | POA: Diagnosis not present

## 2023-12-31 DIAGNOSIS — Z1331 Encounter for screening for depression: Secondary | ICD-10-CM | POA: Diagnosis not present

## 2023-12-31 DIAGNOSIS — E559 Vitamin D deficiency, unspecified: Secondary | ICD-10-CM | POA: Diagnosis not present

## 2023-12-31 DIAGNOSIS — M5030 Other cervical disc degeneration, unspecified cervical region: Secondary | ICD-10-CM | POA: Diagnosis not present

## 2023-12-31 DIAGNOSIS — Z6825 Body mass index (BMI) 25.0-25.9, adult: Secondary | ICD-10-CM | POA: Diagnosis not present

## 2024-01-10 DIAGNOSIS — M5432 Sciatica, left side: Secondary | ICD-10-CM | POA: Diagnosis not present

## 2024-01-10 DIAGNOSIS — M9903 Segmental and somatic dysfunction of lumbar region: Secondary | ICD-10-CM | POA: Diagnosis not present

## 2024-05-23 ENCOUNTER — Ambulatory Visit: Admitting: Physician Assistant

## 2024-05-23 ENCOUNTER — Encounter: Payer: Self-pay | Admitting: Physician Assistant

## 2024-05-23 VITALS — BP 125/85 | HR 74 | Temp 97.9°F | Ht 67.0 in | Wt 162.0 lb

## 2024-05-23 DIAGNOSIS — M1990 Unspecified osteoarthritis, unspecified site: Secondary | ICD-10-CM | POA: Insufficient documentation

## 2024-05-23 DIAGNOSIS — E039 Hypothyroidism, unspecified: Secondary | ICD-10-CM | POA: Insufficient documentation

## 2024-05-23 DIAGNOSIS — S46811D Strain of other muscles, fascia and tendons at shoulder and upper arm level, right arm, subsequent encounter: Secondary | ICD-10-CM

## 2024-05-23 DIAGNOSIS — B009 Herpesviral infection, unspecified: Secondary | ICD-10-CM | POA: Insufficient documentation

## 2024-05-23 DIAGNOSIS — R911 Solitary pulmonary nodule: Secondary | ICD-10-CM | POA: Diagnosis not present

## 2024-05-23 DIAGNOSIS — S46811A Strain of other muscles, fascia and tendons at shoulder and upper arm level, right arm, initial encounter: Secondary | ICD-10-CM | POA: Insufficient documentation

## 2024-05-23 MED ORDER — LIDOCAINE 5 % EX PTCH: 1.0000 | MEDICATED_PATCH | CUTANEOUS | 2 refills | Status: AC

## 2024-05-23 NOTE — Assessment & Plan Note (Signed)
 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 Pain is persistent but fluctuates in intensity. Current management includes celecoxib, lidocaine  patches, and physical therapy. She has concerns about long-term use of celecoxib due to potential kidney damage. Alternating celecoxib with Tylenol Arthritis is advised. Lidocaine  patches provide temporary relief. Consideration of topical treatments like Voltaren gel for additional pain management. - Continue physical therapy exercises. - Use lidocaine  patches as needed, ensuring 12 hours on and 12 hours off. - Consider using Voltaren gel for topical pain relief. - Alternate celecoxib with Tylenol Arthritis to reduce celecoxib use. - Refilled lidocaine  patches prescription.
